# Patient Record
Sex: Female | Born: 1995 | Race: White | Hispanic: No | Marital: Single | Smoking: Never smoker
Health system: Southern US, Community
[De-identification: ages and names within clinical notes are randomized; demographics above are authoritative.]

## PROBLEM LIST (undated history)

## (undated) DIAGNOSIS — F419 Anxiety disorder, unspecified: Secondary | ICD-10-CM

## (undated) DIAGNOSIS — K8020 Calculus of gallbladder without cholecystitis without obstruction: Secondary | ICD-10-CM

## (undated) DIAGNOSIS — F32A Depression, unspecified: Secondary | ICD-10-CM

## (undated) DIAGNOSIS — F329 Major depressive disorder, single episode, unspecified: Secondary | ICD-10-CM

## (undated) HISTORY — PX: NO PAST SURGERIES: SHX2092

---

## 2016-06-05 ENCOUNTER — Encounter (HOSPITAL_COMMUNITY): Payer: Self-pay | Admitting: *Deleted

## 2016-06-05 ENCOUNTER — Emergency Department (HOSPITAL_COMMUNITY): Payer: BLUE CROSS/BLUE SHIELD

## 2016-06-05 ENCOUNTER — Emergency Department (HOSPITAL_COMMUNITY)
Admission: EM | Admit: 2016-06-05 | Discharge: 2016-06-06 | Disposition: A | Discharge status: Home / Self Care | Payer: BLUE CROSS/BLUE SHIELD | Attending: Emergency Medicine | Admitting: Emergency Medicine

## 2016-06-05 DIAGNOSIS — R1030 Lower abdominal pain, unspecified: Secondary | ICD-10-CM | POA: Diagnosis present

## 2016-06-05 DIAGNOSIS — K8020 Calculus of gallbladder without cholecystitis without obstruction: Secondary | ICD-10-CM | POA: Insufficient documentation

## 2016-06-05 DIAGNOSIS — R1011 Right upper quadrant pain: Secondary | ICD-10-CM

## 2016-06-05 LAB — COMPREHENSIVE METABOLIC PANEL
ALT: 18 U/L (ref 14–54)
ANION GAP: 8 (ref 5–15)
AST: 23 U/L (ref 15–41)
Albumin: 4.8 g/dL (ref 3.5–5.0)
Alkaline Phosphatase: 65 U/L (ref 38–126)
BILIRUBIN TOTAL: 0.6 mg/dL (ref 0.3–1.2)
BUN: 11 mg/dL (ref 6–20)
CO2: 27 mmol/L (ref 22–32)
Calcium: 9.5 mg/dL (ref 8.9–10.3)
Chloride: 106 mmol/L (ref 101–111)
Creatinine, Ser: 0.71 mg/dL (ref 0.44–1.00)
Glucose, Bld: 131 mg/dL — ABNORMAL HIGH (ref 65–99)
POTASSIUM: 3.5 mmol/L (ref 3.5–5.1)
Sodium: 141 mmol/L (ref 135–145)
TOTAL PROTEIN: 8.2 g/dL — AB (ref 6.5–8.1)

## 2016-06-05 LAB — URINALYSIS, ROUTINE W REFLEX MICROSCOPIC
Bilirubin Urine: NEGATIVE
Glucose, UA: NEGATIVE mg/dL
KETONES UR: NEGATIVE mg/dL
LEUKOCYTES UA: NEGATIVE
NITRITE: NEGATIVE
PH: 6 (ref 5.0–8.0)
PROTEIN: 30 mg/dL — AB
Specific Gravity, Urine: 1.034 — ABNORMAL HIGH (ref 1.005–1.030)

## 2016-06-05 LAB — CBC
HEMATOCRIT: 39.3 % (ref 36.0–46.0)
Hemoglobin: 12.9 g/dL (ref 12.0–15.0)
MCH: 29.5 pg (ref 26.0–34.0)
MCHC: 32.8 g/dL (ref 30.0–36.0)
MCV: 89.9 fL (ref 78.0–100.0)
PLATELETS: 324 10*3/uL (ref 150–400)
RBC: 4.37 MIL/uL (ref 3.87–5.11)
RDW: 13.7 % (ref 11.5–15.5)
WBC: 10.9 10*3/uL — AB (ref 4.0–10.5)

## 2016-06-05 LAB — URINE MICROSCOPIC-ADD ON

## 2016-06-05 LAB — LIPASE, BLOOD: LIPASE: 22 U/L (ref 11–51)

## 2016-06-05 LAB — POC URINE PREG, ED: Preg Test, Ur: NEGATIVE

## 2016-06-05 MED ORDER — ONDANSETRON 4 MG PO TBDP
4.0000 mg | ORAL_TABLET | Freq: Once | ORAL | Status: AC
  Administered 2016-06-05: 4 mg via ORAL
  Filled 2016-06-05: qty 1

## 2016-06-05 MED ORDER — DICYCLOMINE HCL 10 MG PO CAPS
20.0000 mg | ORAL_CAPSULE | Freq: Once | ORAL | Status: AC
  Administered 2016-06-05: 20 mg via ORAL
  Filled 2016-06-05: qty 2

## 2016-06-05 NOTE — ED Provider Notes (Signed)
[facility]-EMERGENCY DEPT Provider Note   CSN: [id number] Arrival date & time: 06/05/16  2025  By signing my name below, I, [name], attest that this documentation has been prepared under the direction and in the presence of Tomasita CrumbleAdeleke Keefe Zawistowski, MD. Electronically Signed: [name], ED Scribe. 06/05/16. 11:21 PM.   History   Chief Complaint Chief Complaint  Patient presents with  . Back Pain  . Emesis  . Abdominal Pain    HPI  Cassandra Ramirez is a 20 y.o. female who presents to the Emergency Department complaining of intermittent lower abdominal cramping onset 5 PM today. Pt notes that she ate a sandwich prior to the onset of her symptoms, but notes that she vomited following administration of ibuprofen. Denies recent suspicious food intake or sick contacts. She states that she is having associated symptoms of nausea, vomiting x 6 PM today, mid-lower back pain x 5 PM, and intermittent lower abdominal cramping. She states that she has tried OTC 600 mg ibuprofen with no relief for her symptoms. Pt denies diarrhea, difficulty urinating, dysuria, hematuria, fever, vaginal bleeding, vaginal discharge, and any other symptoms. Pt denies issues with her menstrual cycle. Denies abdominal surgeries.    The history is provided by the patient. No language interpreter was used.    History reviewed. No pertinent past medical history.  There are no active problems to display for this patient.   History reviewed. No pertinent surgical history.  OB History    No data available       Home Medications    Prior to Admission medications   Medication Sig Start Date End Date Taking? Authorizing Provider  ibuprofen (ADVIL,MOTRIN) 200 MG tablet Take 600 mg by mouth every 6 (six) hours as needed for moderate pain.   Yes Historical Provider, MD  ORSYTHIA 0.1-20 MG-MCG tablet Take 1 tablet by mouth daily. 04/08/16  Yes Historical Provider, MD    Family History No family history on file.  Social  History Social History  Substance Use Topics  . Smoking status: Never Smoker  . Smokeless tobacco: Never Used  . Alcohol use No     Allergies   Review of patient's allergies indicates no known allergies.   Review of Systems Review of Systems A complete 10 system review of systems was obtained and all systems are negative except as noted in the HPI and PMH.    Physical Exam Updated Vital Signs BP 110/81 (BP Location: Right Arm)   Pulse 74   Temp 97.5 F (36.4 C) (Oral)   Resp 19   LMP 06/05/2016   SpO2 100%   Physical Exam  Constitutional: She is oriented to person, place, and time. She appears well-developed and well-nourished. No distress.  HENT:  Head: Normocephalic and atraumatic.  Nose: Nose normal.  Mouth/Throat: Oropharynx is clear and moist. No oropharyngeal exudate.  Eyes: Conjunctivae and EOM are normal. Pupils are equal, round, and reactive to light. No scleral icterus.  Neck: Normal range of motion. Neck supple. No JVD present. No tracheal deviation present. No thyromegaly present.  Cardiovascular: Normal rate, regular rhythm and normal heart sounds.  Exam reveals no gallop and no friction rub.   No murmur heard. Pulmonary/Chest: Effort normal and breath sounds normal. No respiratory distress. She has no wheezes. She exhibits no tenderness.  Abdominal: Soft. Bowel sounds are normal. She exhibits no distension and no mass. There is tenderness in the right upper quadrant and epigastric area. There is positive Murphy's sign. There is no rebound and no  guarding.  Mid epigastric and RUQ tenderness with positive murphy sign.  Musculoskeletal: Normal range of motion. She exhibits no edema or tenderness.  Lymphadenopathy:    She has no cervical adenopathy.  Neurological: She is alert and oriented to person, place, and time. No cranial nerve deficit. She exhibits normal muscle tone.  Skin: Skin is warm and dry. No rash noted. No erythema. No pallor.  Nursing note and  vitals reviewed.    ED Treatments / Results  DIAGNOSTIC STUDIES: Oxygen Saturation is 100% on RA, nl by my interpretation.    COORDINATION OF CARE: 11:17 PM Discussed treatment plan with pt at bedside which includes UA, labs, US abdomen limited RUQ, and pt agreed to plan.   Labs (all labs ordered are listed, but only abnormal results are displayed) Labs Reviewed  COMPREHENSIVE METABOLIC PANEL - Abnormal; Notable for the following:       Result Value   Glucose, Bld 131 (*)    Total Protein 8.2 (*)    All other components within normal limits  CBC - Abnormal; Notable for the following:    WBC 10.9 (*)    All other components within normal limits  URINALYSIS, ROUTINE W REFLEX MICROSCOPIC (NOT AT [facility]) - Abnormal; Notable for the following:    APPearance CLOUDY (*)    Specific Gravity, Urine 1.034 (*)    Hgb urine dipstick LARGE (*)    Protein, ur 30 (*)    All other components within normal limits  URINE MICROSCOPIC-ADD ON - Abnormal; Notable for the following:    Squamous Epithelial / LPF 0-5 (*)    Bacteria, UA RARE (*)    All other components within normal limits  LIPASE, BLOOD  POC URINE PREG, ED    Radiology [place] Abdomen Limited Ruq  Result Date: 06/06/2016 CLINICAL DATA:  Right upper quadrant pain, nausea and vomiting since 17:00 EXAM: US ABDOMEN LIMITED - RIGHT UPPER QUADRANT COMPARISON:  None. FINDINGS: Gallbladder: Numerous small calculi within the gallbladder lumen measuring up to 5 mm. The patient was not tender to probe pressure over the gallbladder. There is no gallbladder wall thickening or pericholecystic fluid. Common bile duct: Diameter: 3.4 mm Liver: No focal lesion identified. Within normal limits in parenchymal echogenicity. IMPRESSION: Cholelithiasis.  No sonographic evidence of acute cholecystitis. Electronically Signed   By: Ellery Plunkaniel R Mitchell M.D.   On: 06/06/2016 01:09    Procedures Procedures (including critical care time)  Medications Ordered in  ED Medications  dicyclomine (BENTYL) capsule 20 mg (20 mg Oral Given 06/05/16 2330)  ondansetron (ZOFRAN-ODT) disintegrating tablet 4 mg (4 mg Oral Given 06/05/16 2330)     Initial Impression / Assessment and Plan / ED Course  I have reviewed the triage vital signs and the nursing notes.  Pertinent labs & imaging results that were available during my care of the patient were reviewed by me and considered in my medical decision making (see chart for details).  Clinical Course   Patient presents to the ED for abdominal pain in the RUQ with vomiting.  PE is concerning for possible gb pathology.  Labs however are reassuring as the LFTs are all normal.  Will obtain US for further evaluation.  She was given bentyl and zofran for her symptoms.    1:24 AM US reveals cholelithiasis.  Patient educated.  Surgery follow up provided.  Dc home with zofran.  She appears well and in NAD.  VS remain within her normal limits and she is safe for DC.  Final Clinical Impressions(s) / ED Diagnoses   Final diagnoses:  RUQ abdominal pain    New Prescriptions New Prescriptions   No medications on file   I personally performed the services described in this documentation, which was scribed in my presence. The recorded information has been reviewed and is accurate.      Tomasita Crumble, MD 06/06/16 [id number]

## 2016-06-05 NOTE — ED Triage Notes (Signed)
Pt complains of back and abdominal pain since 5PM after picking something up. Pt states she began vomiting 1 hour ago

## 2016-06-05 NOTE — ED Notes (Signed)
Pt made aware of need for urine specimen 

## 2016-06-06 MED ORDER — ONDANSETRON 4 MG PO TBDP: 4.0000 mg | ORAL_TABLET | Freq: Three times a day (TID) | ORAL | 0 refills | Status: DC | PRN

## 2016-06-08 ENCOUNTER — Emergency Department (HOSPITAL_COMMUNITY)
Admission: EM | Admit: 2016-06-08 | Discharge: 2016-06-08 | Disposition: A | Discharge status: Home / Self Care | Payer: BLUE CROSS/BLUE SHIELD | Attending: Emergency Medicine | Admitting: Emergency Medicine

## 2016-06-08 ENCOUNTER — Encounter (HOSPITAL_COMMUNITY): Payer: Self-pay | Admitting: Emergency Medicine

## 2016-06-08 ENCOUNTER — Emergency Department (HOSPITAL_COMMUNITY): Payer: BLUE CROSS/BLUE SHIELD

## 2016-06-08 DIAGNOSIS — R1011 Right upper quadrant pain: Secondary | ICD-10-CM

## 2016-06-08 DIAGNOSIS — R112 Nausea with vomiting, unspecified: Secondary | ICD-10-CM

## 2016-06-08 HISTORY — DX: Calculus of gallbladder without cholecystitis without obstruction: K80.20

## 2016-06-08 LAB — COMPREHENSIVE METABOLIC PANEL
ALT: 16 U/L (ref 14–54)
AST: 17 U/L (ref 15–41)
Albumin: 4.6 g/dL (ref 3.5–5.0)
Alkaline Phosphatase: 66 U/L (ref 38–126)
Anion gap: 10 (ref 5–15)
BILIRUBIN TOTAL: 0.9 mg/dL (ref 0.3–1.2)
BUN: 10 mg/dL (ref 6–20)
CALCIUM: 9.4 mg/dL (ref 8.9–10.3)
CHLORIDE: 103 mmol/L (ref 101–111)
CO2: 25 mmol/L (ref 22–32)
CREATININE: 0.83 mg/dL (ref 0.44–1.00)
Glucose, Bld: 119 mg/dL — ABNORMAL HIGH (ref 65–99)
Potassium: 3.6 mmol/L (ref 3.5–5.1)
Sodium: 138 mmol/L (ref 135–145)
TOTAL PROTEIN: 8.8 g/dL — AB (ref 6.5–8.1)

## 2016-06-08 LAB — URINALYSIS, ROUTINE W REFLEX MICROSCOPIC
Glucose, UA: NEGATIVE mg/dL
KETONES UR: 40 mg/dL — AB
NITRITE: NEGATIVE
PROTEIN: 30 mg/dL — AB
Specific Gravity, Urine: 1.03 (ref 1.005–1.030)
pH: 6 (ref 5.0–8.0)

## 2016-06-08 LAB — CBC
HCT: 41 % (ref 36.0–46.0)
Hemoglobin: 13.5 g/dL (ref 12.0–15.0)
MCH: 29.7 pg (ref 26.0–34.0)
MCHC: 32.9 g/dL (ref 30.0–36.0)
MCV: 90.1 fL (ref 78.0–100.0)
PLATELETS: 340 10*3/uL (ref 150–400)
RBC: 4.55 MIL/uL (ref 3.87–5.11)
RDW: 13.4 % (ref 11.5–15.5)
WBC: 19.4 10*3/uL — AB (ref 4.0–10.5)

## 2016-06-08 LAB — URINE MICROSCOPIC-ADD ON: RBC / HPF: NONE SEEN RBC/hpf (ref 0–5)

## 2016-06-08 LAB — LIPASE, BLOOD: LIPASE: 15 U/L (ref 11–51)

## 2016-06-08 MED ORDER — MORPHINE SULFATE (PF) 2 MG/ML IV SOLN
4.0000 mg | Freq: Once | INTRAVENOUS | Status: AC
  Administered 2016-06-08: 4 mg via INTRAVENOUS
  Filled 2016-06-08: qty 2

## 2016-06-08 MED ORDER — ONDANSETRON HCL 4 MG PO TABS: 4.0000 mg | ORAL_TABLET | Freq: Four times a day (QID) | ORAL | 0 refills | Status: DC

## 2016-06-08 MED ORDER — OXYCODONE-ACETAMINOPHEN 5-325 MG PO TABS: 2.0000 | ORAL_TABLET | ORAL | 0 refills | Status: DC | PRN

## 2016-06-08 MED ORDER — ONDANSETRON HCL 4 MG/2ML IJ SOLN
4.0000 mg | Freq: Once | INTRAMUSCULAR | Status: AC
  Administered 2016-06-08: 4 mg via INTRAVENOUS
  Filled 2016-06-08: qty 2

## 2016-06-08 MED ORDER — OXYCODONE-ACETAMINOPHEN 5-325 MG PO TABS
1.0000 | ORAL_TABLET | Freq: Once | ORAL | Status: AC
  Administered 2016-06-08: 1 via ORAL
  Filled 2016-06-08: qty 1

## 2016-06-08 NOTE — ED Triage Notes (Signed)
Pt reports RUQ pain since ED visit a few days ago. Was diagnosed with gallstones during that visit. Pt reports pain has gotten worse. No n/v/d

## 2016-06-08 NOTE — Discharge Instructions (Signed)

## 2016-06-08 NOTE — ED Provider Notes (Signed)
Emergency Department Provider Note   I have reviewed the triage vital signs and the nursing notes.   HISTORY  Chief Complaint Abdominal Pain   HPI Cassandra Ramirez is a 20 y.o. female previously diagnosed with gallstones on 11/2 present emergency department for evaluation of continued epigastric pain, nausea, vomiting. She was seen in the emergency department last week where an ultrasound showed gallstones with no evidence of cholecystitis. She followed up with general surgery and has an appointment in 2 days' time. She continued to have epigastric abdominal discomfort and developed new onset nausea and vomiting this evening. She denies fever or shaking chills. No chest pain or difficulty breathing. She's been unable to tolerate anything by mouth.    Past Medical History:  Diagnosis Date  . Gallstone     There are no active problems to display for this patient.   History reviewed. No pertinent surgical history.  Current Outpatient Rx  . Order #: [id number] Class: Historical Med  . Order #: [id number] Class: Print  . Order #: [id number] Class: Historical Med  . Order #: [id number] Class: Print  . Order #: [id number] Class: Print    Allergies Patient has no known allergies.  History reviewed. No pertinent family history.  Social History Social History  Substance Use Topics  . Smoking status: Never Smoker  . Smokeless tobacco: Never Used  . Alcohol use No    Review of Systems  Constitutional: No fever/chills Eyes: No visual changes. ENT: No sore throat. Cardiovascular: Denies chest pain. Respiratory: Denies shortness of breath. Gastrointestinal: Positive RUQ abdominal pain. Positive nausea and vomiting.  No diarrhea.  No constipation. Genitourinary: Negative for dysuria. Musculoskeletal: Negative for back pain. Skin: Negative for rash. Neurological: Negative for headaches, focal weakness or numbness.  10-point ROS otherwise  negative.  ____________________________________________   PHYSICAL EXAM:  VITAL SIGNS: ED Triage Vitals  Enc Vitals Group     BP 06/08/16 1714 113/67     Pulse Rate 06/08/16 1714 76     Resp 06/08/16 1714 16     Temp 06/08/16 1714 98.7 F (37.1 C)     Temp Source 06/08/16 1714 Oral     SpO2 06/08/16 1714 98 %     Weight 06/08/16 1715 130 lb (59 kg)     Height 06/08/16 1715 5\' 2"  (1.575 m)     Pain Score 06/08/16 1717 8   Constitutional: Alert and oriented. Appears uncomfortable with active vomiting.  Eyes: Conjunctivae are normal. Head: Atraumatic. Nose: No congestion/rhinnorhea. Mouth/Throat: Mucous membranes are moist.  Oropharynx non-erythematous. Neck: No stridor.  Cardiovascular: Normal rate, regular rhythm. Good peripheral circulation. Grossly normal heart sounds.   Respiratory: Normal respiratory effort.  No retractions. Lungs CTAB. Gastrointestinal: Soft with RUQ tenderness. No rebound or guarding. No distention.  Musculoskeletal: No lower extremity tenderness nor edema. No gross deformities of extremities. Neurologic:  Normal speech and language. No gross focal neurologic deficits are appreciated.  Skin:  Skin is warm, dry and intact. No rash noted.  ____________________________________________   LABS (all labs ordered are listed, but only abnormal results are displayed)  Labs Reviewed  COMPREHENSIVE METABOLIC PANEL - Abnormal; Notable for the following:       Result Value   Glucose, Bld 119 (*)    Total Protein 8.8 (*)    All other components within normal limits  CBC - Abnormal; Notable for the following:    WBC 19.4 (*)    All other components within normal limits  URINALYSIS, ROUTINE W REFLEX MICROSCOPIC (NOT AT [facility]) -  Abnormal; Notable for the following:    Color, Urine AMBER (*)    APPearance CLOUDY (*)    Hgb urine dipstick TRACE (*)    Bilirubin Urine SMALL (*)    Ketones, ur 40 (*)    Protein, ur 30 (*)    Leukocytes, UA SMALL (*)    All  other components within normal limits  URINE MICROSCOPIC-ADD ON - Abnormal; Notable for the following:    Squamous Epithelial / LPF TOO NUMEROUS TO COUNT (*)    Bacteria, UA MANY (*)    All other components within normal limits  LIPASE, BLOOD   ____________________________________________  RADIOLOGY  [place] Abdomen Limited Ruq  Result Date: 06/08/2016 CLINICAL DATA:  Worsening right upper quadrant abdominal pain for 3 days. Cholelithiasis. EXAM: US ABDOMEN LIMITED - RIGHT UPPER QUADRANT COMPARISON:  06/06/2016 FINDINGS: Gallbladder: Multiple tiny gallstones are again seen measuring up to 4 mm. No evidence of gallbladder wall thickening or pericholecystic fluid. No sonographic Murphy sign noted by sonographer. Common bile duct: Diameter: 4 mm, within normal limits. Liver: No focal lesion identified. Within normal limits in parenchymal echogenicity. IMPRESSION: Cholelithiasis again noted. No sonographic signs of acute cholecystitis or biliary dilatation. Electronically Signed   By: Myles RosenthalJohn  Stahl M.D.   On: 06/08/2016 20:54    ____________________________________________   PROCEDURES  Procedure(s) performed:   Procedures  None ____________________________________________   INITIAL IMPRESSION / ASSESSMENT AND PLAN / ED COURSE  Pertinent labs & imaging results that were available during my care of the patient were reviewed by me and considered in my medical decision making (see chart for details).  Patient presents to the emergency department for evaluation of epigastric abdominal discomfort, nausea, vomiting. She has a tender epigastrium and active vomiting during my exam. Given Zofran and will send for repeat ultrasound right upper quadrant to rule out acute cholecystitis. She does have a white count of 19 but is afebrile.  10:59 PM Spoke with general surgery Dr. Daphine DeutscherMartin in the OR. With normal liver enzymes he would prefer pain control and discharge home with scheduled outpatient surgery  follow up. Patient is feeling better after morphine and percocet. Mom and Dad at bedside. Plan for discharge to home and pain and nausea medication with plan to follow with surgery on Tuesday. Discussed return precautions in detail including reasons to return sooner.  ____________________________________________  FINAL CLINICAL IMPRESSION(S) / ED DIAGNOSES  Final diagnoses:  RUQ pain  Right upper quadrant abdominal pain  Non-intractable vomiting with nausea, unspecified vomiting type     MEDICATIONS GIVEN DURING THIS VISIT:  Medications  ondansetron (ZOFRAN) injection 4 mg (4 mg Intravenous Given 06/08/16 2002)  morphine 2 MG/ML injection 4 mg (4 mg Intravenous Given 06/08/16 2201)  oxyCODONE-acetaminophen (PERCOCET/ROXICET) 5-325 MG per tablet 1 tablet (1 tablet Oral Given 06/08/16 2310)     NEW OUTPATIENT MEDICATIONS STARTED DURING THIS VISIT:  Discharge Medication List as of 06/08/2016 11:03 PM    START taking these medications   Details  ondansetron (ZOFRAN) 4 MG tablet Take 1 tablet (4 mg total) by mouth every 6 (six) hours., Starting Sun 06/08/2016, Print    oxyCODONE-acetaminophen (PERCOCET/ROXICET) 5-325 MG tablet Take 2 tablets by mouth every 4 (four) hours as needed for severe pain., Starting Sun 06/08/2016, Print          Note:  This document was prepared using Dragon voice recognition software and may include unintentional dictation errors.  Alona BeneJoshua Long, MD Emergency Medicine   Maia PlanJoshua G Long, MD 06/09/16 1044

## 2016-06-08 NOTE — ED Notes (Signed)
US tech at bedside

## 2016-06-10 ENCOUNTER — Encounter (HOSPITAL_COMMUNITY): Payer: Self-pay | Admitting: *Deleted

## 2016-06-10 ENCOUNTER — Other Ambulatory Visit: Payer: Self-pay | Admitting: General Surgery

## 2016-06-10 ENCOUNTER — Inpatient Hospital Stay (HOSPITAL_COMMUNITY)
Admission: AD | Admit: 2016-06-10 | Discharge: 2016-06-13 | DRG: 419 | Disposition: A | Discharge status: Home / Self Care | Payer: BLUE CROSS/BLUE SHIELD | Source: Ambulatory Visit | Attending: General Surgery | Admitting: General Surgery

## 2016-06-10 ENCOUNTER — Ambulatory Visit: Payer: Self-pay | Admitting: General Surgery

## 2016-06-10 DIAGNOSIS — K8000 Calculus of gallbladder with acute cholecystitis without obstruction: Principal | ICD-10-CM | POA: Diagnosis present

## 2016-06-10 DIAGNOSIS — K8020 Calculus of gallbladder without cholecystitis without obstruction: Secondary | ICD-10-CM

## 2016-06-10 DIAGNOSIS — Z8249 Family history of ischemic heart disease and other diseases of the circulatory system: Secondary | ICD-10-CM

## 2016-06-10 DIAGNOSIS — F329 Major depressive disorder, single episode, unspecified: Secondary | ICD-10-CM | POA: Diagnosis present

## 2016-06-10 DIAGNOSIS — M549 Dorsalgia, unspecified: Secondary | ICD-10-CM | POA: Diagnosis present

## 2016-06-10 DIAGNOSIS — K810 Acute cholecystitis: Secondary | ICD-10-CM | POA: Diagnosis present

## 2016-06-10 DIAGNOSIS — Z833 Family history of diabetes mellitus: Secondary | ICD-10-CM

## 2016-06-10 DIAGNOSIS — F419 Anxiety disorder, unspecified: Secondary | ICD-10-CM | POA: Diagnosis present

## 2016-06-10 HISTORY — DX: Depression, unspecified: F32.A

## 2016-06-10 HISTORY — DX: Major depressive disorder, single episode, unspecified: F32.9

## 2016-06-10 HISTORY — DX: Anxiety disorder, unspecified: F41.9

## 2016-06-10 MED ORDER — KCL IN DEXTROSE-NACL 20-5-0.45 MEQ/L-%-% IV SOLN
INTRAVENOUS | Status: DC
Start: 2016-06-10 — End: 2016-06-13
  Administered 2016-06-10 – 2016-06-12 (×3): via INTRAVENOUS
  Filled 2016-06-10 (×6): qty 1000

## 2016-06-10 MED ORDER — HYDROMORPHONE HCL 1 MG/ML IJ SOLN
0.5000 mg | INTRAMUSCULAR | Status: DC | PRN
Start: 2016-06-10 — End: 2016-06-13
  Administered 2016-06-11 – 2016-06-12 (×8): 0.5 mg via INTRAVENOUS
  Filled 2016-06-10 (×8): qty 1

## 2016-06-10 MED ORDER — CHLORHEXIDINE GLUCONATE CLOTH 2 % EX PADS
6.0000 | MEDICATED_PAD | Freq: Once | CUTANEOUS | Status: AC
  Administered 2016-06-10: 6 via TOPICAL

## 2016-06-10 MED ORDER — HYDROCODONE-ACETAMINOPHEN 5-325 MG PO TABS
1.0000 | ORAL_TABLET | ORAL | Status: DC | PRN
  Administered 2016-06-11: 2 via ORAL
  Administered 2016-06-12: 1 via ORAL
  Administered 2016-06-12 (×2): 2 via ORAL
  Administered 2016-06-12: 1 via ORAL
  Administered 2016-06-13: 2 via ORAL
  Filled 2016-06-10: qty 2
  Filled 2016-06-10: qty 1
  Filled 2016-06-10 (×4): qty 2

## 2016-06-10 MED ORDER — CHLORHEXIDINE GLUCONATE CLOTH 2 % EX PADS: 6.0000 | MEDICATED_PAD | Freq: Once | CUTANEOUS | Status: DC

## 2016-06-10 MED ORDER — SODIUM CHLORIDE 0.45 % IV SOLN: INTRAVENOUS | Status: DC

## 2016-06-10 MED ORDER — CEFAZOLIN SODIUM-DEXTROSE 2-4 GM/100ML-% IV SOLN: 2.0000 g | INTRAVENOUS | Status: DC

## 2016-06-10 MED ORDER — PIPERACILLIN-TAZOBACTAM 3.375 G IVPB
3.3750 g | Freq: Three times a day (TID) | INTRAVENOUS | Status: DC
  Administered 2016-06-10 – 2016-06-13 (×9): 3.375 g via INTRAVENOUS
  Filled 2016-06-10 (×10): qty 50

## 2016-06-10 MED ORDER — ONDANSETRON HCL 4 MG/2ML IJ SOLN: 2.0000 mg | Freq: Four times a day (QID) | INTRAMUSCULAR | Status: DC | PRN

## 2016-06-10 NOTE — H&P (Signed)
History of Present Illness Cassandra Ramirez(Cassandra Vialpando MD; 06/10/2016 3:15 PM) The patient is a 20 year old female who presents for evaluation of gall stones. The patient is a 20 year old female with a 3 to four-day history of right upper quadrant abdominal pain. Patient was recently seen in the ER twice in 1 day secondary to right upper quadrant pain, nausea, vomiting. Patient underwent ultrasound which revealed gallstones. Patient had an elevated WBC count of 19.  Patient states that she's continue with abdominal pain. She set minimal by mouth intake.  She was denies any fever or chills night sweats.   Other Problems Cassandra Ramirez(Cassandra [place], [place]; 06/10/2016 2:28 PM) Anxiety Disorder Back Pain Depression  Past Surgical History Cassandra Ramirez(Cassandra [place], [place]; 06/10/2016 2:28 PM) No pertinent past surgical history  Diagnostic Studies History Cassandra Ramirez(Cassandra [place], [place]; 06/10/2016 2:28 PM) Colonoscopy never Mammogram never Pap Smear never  Allergies Cassandra Ramirez(Cassandra Ramirez, CMA; 06/10/2016 2:31 PM) No Known Drug Allergies11/02/2016  Medication History Cassandra Ramirez(Cassandra [place], [place]; 06/10/2016 2:32 PM) Ondansetron (4MG  Film, Oral) Active. Oxycodone-Acetaminophen (5-325MG  Tablet, Oral) Active. Levonorgest-Eth Estrad 91-Day (0.1-0.02 & 0.01MG  Tablet, Oral) Active. Medications Reconciled  Social History Cassandra Ramirez(Cassandra Ramirez, [place]; 06/10/2016 2:28 PM) Caffeine use Carbonated beverages, Tea. No alcohol use No drug use Tobacco use Never smoker.  Family History Cassandra Ramirez(Cassandra [place], [place]; 06/10/2016 2:28 PM) Diabetes Mellitus Mother. Heart Disease Father.  Pregnancy / Birth History Cassandra Ramirez(Cassandra [place], [place]; 06/10/2016 2:28 PM) Age at menarche 12 years. Contraceptive History Oral contraceptives. Gravida 0 Para 0 Regular periods    Review of Systems Cassandra Ramirez(Cassandra Ramirez CMA; 06/10/2016 2:28 PM) General Present- Appetite Loss and Fatigue. Not Present- Chills, Fever, Night Sweats, Weight Gain and Weight Loss. Skin Not Present- Change in  Wart/Mole, Dryness, Hives, Jaundice, New Lesions, Non-Healing Wounds, Rash and Ulcer. HEENT Present- Wears glasses/contact lenses. Not Present- Earache, Hearing Loss, Hoarseness, Nose Bleed, Oral Ulcers, Ringing in the Ears, Seasonal Allergies, Sinus Pain, Sore Throat, Visual Disturbances and Yellow Eyes. Respiratory Not Present- Bloody sputum, Chronic Cough, Difficulty Breathing, Snoring and Wheezing. Breast Not Present- Breast Mass, Breast Pain, Nipple Discharge and Skin Changes. Cardiovascular Not Present- Chest Pain, Difficulty Breathing Lying Down, Leg Cramps, Palpitations, Rapid Heart Rate, Shortness of Breath and Swelling of Extremities. Gastrointestinal Present- Abdominal Pain, Nausea and Vomiting. Not Present- Bloating, Bloody Stool, Change in Bowel Habits, Chronic diarrhea, Constipation, Difficulty Swallowing, Excessive gas, Gets full quickly at meals, Hemorrhoids, Indigestion and Rectal Pain. Female Genitourinary Not Present- Frequency, Nocturia, Painful Urination, Pelvic Pain and Urgency. Musculoskeletal Present- Back Pain. Not Present- Joint Pain, Joint Stiffness, Muscle Pain, Muscle Weakness and Swelling of Extremities. Neurological Present- Decreased Memory, Trouble walking and Weakness. Not Present- Fainting, Headaches, Numbness, Seizures, Tingling and Tremor. Psychiatric Present- Change in Sleep Pattern, Depression and Fearful. Not Present- Anxiety, Bipolar and Frequent crying. Endocrine Not Present- Cold Intolerance, Excessive Hunger, Hair Changes, Heat Intolerance, Hot flashes and New Diabetes. Hematology Not Present- Blood Thinners, Easy Bruising, Excessive bleeding, Gland problems, HIV and Persistent Infections.  Vitals Cassandra Ramirez(Cassandra Ramirez CMA; 06/10/2016 2:33 PM) 06/10/2016 2:32 PM Weight: 132.2 lb (59th percentile) Height: 62in (18th percentile) Body Surface Area: 1.6 m Body Mass Index: 24.18 kg/m  (74th percentile)  Temp.: 98.96F  Pulse: 112 (Regular)  BP:  112/62 (Sitting, Left Arm, Standard)  Percentiles calculated using CDC data for children 2-20 years.     Physical Exam Cassandra Ramirez(Cassandra Cromartie MD; 06/10/2016 3:16 PM) General Mental Status-Alert. General Appearance-Consistent with stated age. Hydration-Well hydrated. Voice-Normal.  Head and Neck Head-normocephalic, atraumatic with no lesions or palpable masses.  Eye Eyeball -  Bilateral-Extraocular movements intact. Sclera/Conjunctiva - Bilateral-No scleral icterus.  Chest and Lung Exam Chest and lung exam reveals -quiet, even and easy respiratory effort with no use of accessory muscles. Inspection Chest Wall - Normal. Back - normal.  Cardiovascular Cardiovascular examination reveals -normal heart sounds, regular rate and rhythm with no murmurs.  Abdomen Inspection Normal Exam - No Hernias. Palpation/Percussion Normal exam - Soft, No Rebound tenderness, No Rigidity (guarding) and No hepatosplenomegaly. Tenderness - Right Upper Quadrant(With a fullness sensation to the right upper quadrant). Auscultation Normal exam - Bowel sounds normal.  Neurologic Neurologic evaluation reveals -alert and oriented x 3 with no impairment of recent or remote memory. Mental Status-Normal.  Musculoskeletal Normal Exam - Left-Upper Extremity Strength Normal and Lower Extremity Strength Normal. Normal Exam - Right-Upper Extremity Strength Normal, Lower Extremity Weakness.    Assessment & Plan Cassandra Ramirez(Cassandra Odeh MD; 06/10/2016 3:17 PM) ACUTE CHOLECYSTITIS (K81.0) Impression: 20 year old female with likely acute cholecystitis, cholelithiasis  1. We'll plan on laparoscopic cholecystectomy +/- IOC 2. I spoke with Dr. Carolynne Edouardoth in regards to admission, proceeding with surgery.

## 2016-06-10 NOTE — Progress Notes (Signed)
Pt arrived unit as a direct admission. Pt is alert and oriented, able to communicate needs. Will continue with current plan of care.

## 2016-06-11 ENCOUNTER — Encounter (HOSPITAL_COMMUNITY): Admission: AD | Disposition: A | Discharge status: Home / Self Care | Payer: Self-pay | Source: Ambulatory Visit

## 2016-06-11 ENCOUNTER — Observation Stay (HOSPITAL_COMMUNITY): Payer: BLUE CROSS/BLUE SHIELD

## 2016-06-11 ENCOUNTER — Observation Stay (HOSPITAL_COMMUNITY): Payer: BLUE CROSS/BLUE SHIELD | Admitting: Anesthesiology

## 2016-06-11 ENCOUNTER — Encounter (HOSPITAL_COMMUNITY): Payer: Self-pay | Admitting: Certified Registered"

## 2016-06-11 HISTORY — PX: CHOLECYSTECTOMY: SHX55

## 2016-06-11 LAB — SURGICAL PCR SCREEN
MRSA, PCR: NEGATIVE
STAPHYLOCOCCUS AUREUS: NEGATIVE

## 2016-06-11 SURGERY — LAPAROSCOPIC CHOLECYSTECTOMY WITH INTRAOPERATIVE CHOLANGIOGRAM
Anesthesia: General | Site: Abdomen

## 2016-06-11 MED ORDER — LACTATED RINGERS IR SOLN
Status: DC | PRN
  Administered 2016-06-11: 3000 mL

## 2016-06-11 MED ORDER — SUCCINYLCHOLINE CHLORIDE 20 MG/ML IJ SOLN
INTRAMUSCULAR | Status: AC
  Filled 2016-06-11: qty 1

## 2016-06-11 MED ORDER — MIDAZOLAM HCL 2 MG/2ML IJ SOLN: 0.5000 mg | Freq: Once | INTRAMUSCULAR | Status: DC | PRN

## 2016-06-11 MED ORDER — LACTATED RINGERS IV SOLN
INTRAVENOUS | Status: DC
Start: 2016-06-11 — End: 2016-06-11

## 2016-06-11 MED ORDER — HYDROMORPHONE HCL 1 MG/ML IJ SOLN
0.2500 mg | INTRAMUSCULAR | Status: DC | PRN
  Administered 2016-06-11 (×2): 0.5 mg via INTRAVENOUS

## 2016-06-11 MED ORDER — ROCURONIUM BROMIDE 50 MG/5ML IV SOSY
PREFILLED_SYRINGE | INTRAVENOUS | Status: DC | PRN
  Administered 2016-06-11: 40 mg via INTRAVENOUS
  Administered 2016-06-11: 5 mg via INTRAVENOUS

## 2016-06-11 MED ORDER — ONDANSETRON HCL 4 MG/2ML IJ SOLN
INTRAMUSCULAR | Status: AC
  Filled 2016-06-11: qty 2

## 2016-06-11 MED ORDER — HYDROMORPHONE HCL 1 MG/ML IJ SOLN
INTRAMUSCULAR | Status: AC
  Filled 2016-06-11: qty 1

## 2016-06-11 MED ORDER — IOPAMIDOL (ISOVUE-300) INJECTION 61%
INTRAVENOUS | Status: DC | PRN
  Administered 2016-06-11: 2.5 mL

## 2016-06-11 MED ORDER — ONDANSETRON HCL 4 MG/2ML IJ SOLN
INTRAMUSCULAR | Status: DC | PRN
  Administered 2016-06-11: 4 mg via INTRAVENOUS

## 2016-06-11 MED ORDER — LIDOCAINE 2% (20 MG/ML) 5 ML SYRINGE
INTRAMUSCULAR | Status: AC
  Filled 2016-06-11: qty 5

## 2016-06-11 MED ORDER — FENTANYL CITRATE (PF) 250 MCG/5ML IJ SOLN
INTRAMUSCULAR | Status: AC
  Filled 2016-06-11: qty 5

## 2016-06-11 MED ORDER — FENTANYL CITRATE (PF) 250 MCG/5ML IJ SOLN
INTRAMUSCULAR | Status: DC | PRN
  Administered 2016-06-11 (×3): 50 ug via INTRAVENOUS
  Administered 2016-06-11 (×2): 25 ug via INTRAVENOUS

## 2016-06-11 MED ORDER — PROPOFOL 10 MG/ML IV BOLUS
INTRAVENOUS | Status: AC
  Filled 2016-06-11: qty 20

## 2016-06-11 MED ORDER — ROCURONIUM BROMIDE 50 MG/5ML IV SOSY
PREFILLED_SYRINGE | INTRAVENOUS | Status: AC
Start: 2016-06-11 — End: 2016-06-11
  Filled 2016-06-11: qty 5

## 2016-06-11 MED ORDER — MIDAZOLAM HCL 2 MG/2ML IJ SOLN
INTRAMUSCULAR | Status: AC
  Filled 2016-06-11: qty 2

## 2016-06-11 MED ORDER — LACTATED RINGERS IV SOLN
INTRAVENOUS | Status: DC | PRN
  Administered 2016-06-11 (×3): via INTRAVENOUS

## 2016-06-11 MED ORDER — MIDAZOLAM HCL 5 MG/5ML IJ SOLN
INTRAMUSCULAR | Status: DC | PRN
  Administered 2016-06-11 (×2): 1 mg via INTRAVENOUS

## 2016-06-11 MED ORDER — DEXAMETHASONE SODIUM PHOSPHATE 10 MG/ML IJ SOLN
INTRAMUSCULAR | Status: DC | PRN
  Administered 2016-06-11: 10 mg via INTRAVENOUS

## 2016-06-11 MED ORDER — IOPAMIDOL (ISOVUE-300) INJECTION 61%
INTRAVENOUS | Status: AC
  Filled 2016-06-11: qty 50

## 2016-06-11 MED ORDER — MEPERIDINE HCL 50 MG/ML IJ SOLN: 6.2500 mg | INTRAMUSCULAR | Status: DC | PRN

## 2016-06-11 MED ORDER — PROPOFOL 10 MG/ML IV BOLUS
INTRAVENOUS | Status: DC | PRN
  Administered 2016-06-11: 140 mg via INTRAVENOUS
  Administered 2016-06-11: 40 mg via INTRAVENOUS
  Administered 2016-06-11: 20 mg via INTRAVENOUS

## 2016-06-11 MED ORDER — SUGAMMADEX SODIUM 200 MG/2ML IV SOLN
INTRAVENOUS | Status: AC
  Filled 2016-06-11: qty 2

## 2016-06-11 MED ORDER — 0.9 % SODIUM CHLORIDE (POUR BTL) OPTIME
TOPICAL | Status: DC | PRN
  Administered 2016-06-11: 1000 mL

## 2016-06-11 MED ORDER — BUPIVACAINE HCL (PF) 0.25 % IJ SOLN
INTRAMUSCULAR | Status: DC | PRN
  Administered 2016-06-11: 25 mL

## 2016-06-11 MED ORDER — DIPHENHYDRAMINE HCL 50 MG/ML IJ SOLN
12.5000 mg | Freq: Four times a day (QID) | INTRAMUSCULAR | Status: DC | PRN
  Administered 2016-06-12: 12.5 mg via INTRAVENOUS
  Filled 2016-06-11: qty 1

## 2016-06-11 MED ORDER — SUGAMMADEX SODIUM 200 MG/2ML IV SOLN
INTRAVENOUS | Status: DC | PRN
  Administered 2016-06-11: 150 mg via INTRAVENOUS

## 2016-06-11 MED ORDER — PROMETHAZINE HCL 25 MG/ML IJ SOLN: 6.2500 mg | INTRAMUSCULAR | Status: DC | PRN

## 2016-06-11 MED ORDER — LIDOCAINE 2% (20 MG/ML) 5 ML SYRINGE
INTRAMUSCULAR | Status: DC | PRN
  Administered 2016-06-11: 20 mg via INTRAVENOUS

## 2016-06-11 MED ORDER — DEXAMETHASONE SODIUM PHOSPHATE 10 MG/ML IJ SOLN
INTRAMUSCULAR | Status: AC
  Filled 2016-06-11: qty 1

## 2016-06-11 MED ORDER — BUPIVACAINE HCL (PF) 0.25 % IJ SOLN
INTRAMUSCULAR | Status: AC
  Filled 2016-06-11: qty 30

## 2016-06-11 SURGICAL SUPPLY — 30 items
APPLIER CLIP 5 13 M/L LIGAMAX5 (MISCELLANEOUS) ×2
CABLE HIGH FREQUENCY MONO STRZ (ELECTRODE) ×2 IMPLANT
CATH REDDICK CHOLANGI 4FR 50CM (CATHETERS) ×2 IMPLANT
CHLORAPREP W/TINT 26ML (MISCELLANEOUS) ×2 IMPLANT
CLIP APPLIE 5 13 M/L LIGAMAX5 (MISCELLANEOUS) ×1 IMPLANT
COVER MAYO STAND STRL (DRAPES) ×2 IMPLANT
COVER SURGICAL LIGHT HANDLE (MISCELLANEOUS) ×2 IMPLANT
DECANTER SPIKE VIAL GLASS SM (MISCELLANEOUS) ×2 IMPLANT
DERMABOND ADVANCED (GAUZE/BANDAGES/DRESSINGS) ×1
DERMABOND ADVANCED .7 DNX12 (GAUZE/BANDAGES/DRESSINGS) ×1 IMPLANT
DRAPE C-ARM 42X120 X-RAY (DRAPES) ×2 IMPLANT
ELECT REM PT RETURN 9FT ADLT (ELECTROSURGICAL) ×2
ELECTRODE REM PT RTRN 9FT ADLT (ELECTROSURGICAL) ×1 IMPLANT
GLOVE BIO SURGEON STRL SZ7.5 (GLOVE) ×2 IMPLANT
GOWN STRL REUS W/TWL XL LVL3 (GOWN DISPOSABLE) ×6 IMPLANT
HEMOSTAT SURGICEL 4X8 (HEMOSTASIS) IMPLANT
IRRIG SUCT STRYKERFLOW 2 WTIP (MISCELLANEOUS) ×2
IRRIGATION SUCT STRKRFLW 2 WTP (MISCELLANEOUS) ×1 IMPLANT
IV CATH 14GX2 1/4 (CATHETERS) ×2 IMPLANT
KIT BASIN OR (CUSTOM PROCEDURE TRAY) ×2 IMPLANT
POUCH SPECIMEN RETRIEVAL 10MM (ENDOMECHANICALS) ×2 IMPLANT
SCISSORS LAP 5X35 DISP (ENDOMECHANICALS) ×2 IMPLANT
SLEEVE XCEL OPT CAN 5 100 (ENDOMECHANICALS) ×4 IMPLANT
SUT MNCRL AB 4-0 PS2 18 (SUTURE) ×2 IMPLANT
TOWEL OR 17X26 10 PK STRL BLUE (TOWEL DISPOSABLE) ×2 IMPLANT
TOWEL OR NON WOVEN STRL DISP B (DISPOSABLE) ×2 IMPLANT
TRAY LAPAROSCOPIC (CUSTOM PROCEDURE TRAY) ×2 IMPLANT
TROCAR BLADELESS OPT 5 100 (ENDOMECHANICALS) ×2 IMPLANT
TROCAR XCEL BLUNT TIP 100MML (ENDOMECHANICALS) ×2 IMPLANT
TUBING INSUF HEATED (TUBING) ×2 IMPLANT

## 2016-06-11 NOTE — Anesthesia Postprocedure Evaluation (Signed)
Anesthesia Post Note  Patient: Cassandra PartridgeRachel Ramirez  Procedure(s) Performed: Procedure(s) (LRB): LAPAROSCOPIC CHOLECYSTECTOMY WITH INTRAOPERATIVE CHOLANGIOGRAM (N/A)  Patient location during evaluation: PACU Anesthesia Type: General Level of consciousness: sedated, oriented and patient cooperative Pain management: pain level controlled Vital Signs Assessment: post-procedure vital signs reviewed and stable Respiratory status: spontaneous breathing, nonlabored ventilation and respiratory function stable Cardiovascular status: blood pressure returned to baseline and stable Postop Assessment: no signs of nausea or vomiting Anesthetic complications: no    Last Vitals:  Vitals:   06/11/16 1342 06/11/16 1350  BP: 122/76 110/64  Pulse: 88 86  Resp: 15 14  Temp: 37.1 C 36.8 C    Last Pain:  Vitals:   06/11/16 1350  TempSrc: Oral  PainSc:                  Addaline Peplinski,E. Jameson Tormey

## 2016-06-11 NOTE — Anesthesia Procedure Notes (Signed)
Procedure Name: Intubation Date/Time: 06/11/2016 10:44 AM Performed by: Minerva EndsMIRARCHI, Aoi Kouns M Pre-anesthesia Checklist: Patient identified, Emergency Drugs available, Suction available and Patient being monitored Patient Re-evaluated:Patient Re-evaluated prior to inductionOxygen Delivery Method: Circle System Utilized Preoxygenation: Pre-oxygenation with 100% oxygen Intubation Type: IV induction Ventilation: Mask ventilation without difficulty Laryngoscope Size: Miller and 2 Grade View: Grade I Tube type: Oral Number of attempts: 1 Airway Equipment and Method: Stylet Placement Confirmation: ETT inserted through vocal cords under direct vision,  positive ETCO2 and breath sounds checked- equal and bilateral Secured at: 21 cm Tube secured with: Tape Dental Injury: Teeth and Oropharynx as per pre-operative assessment  Comments: Smooth IV induction Jean RosenthalJackson--- intubation AM CRNA-- atraumatic--- teeth and mouth as preop-- bilat BS Jean RosenthalJackson

## 2016-06-11 NOTE — Progress Notes (Signed)
Pt back to unit from PACU. Drowsy but easily arusable. Will continue with current plan of care.

## 2016-06-11 NOTE — H&P (View-Only) (Signed)
History of Present Illness (Sandrine Bloodsworth MD; 06/10/2016 3:15 PM) The patient is a 20 year old female who presents for evaluation of gall stones. The patient is a 20-year-old female with a 3 to four-day history of right upper quadrant abdominal pain. Patient was recently seen in the ER twice in 1 day secondary to right upper quadrant pain, nausea, vomiting. Patient underwent ultrasound which revealed gallstones. Patient had an elevated WBC count of 19.  Patient states that she's continue with abdominal pain. She set minimal by mouth intake.  She was denies any fever or chills night sweats.   Other Problems (Sade Bradford, CMA; 06/10/2016 2:28 PM) Anxiety Disorder Back Pain Depression  Past Surgical History (Sade Bradford, CMA; 06/10/2016 2:28 PM) No pertinent past surgical history  Diagnostic Studies History (Sade Bradford, CMA; 06/10/2016 2:28 PM) Colonoscopy never Mammogram never Pap Smear never  Allergies (Sade Bradford, CMA; 06/10/2016 2:31 PM) No Known Drug Allergies11/02/2016  Medication History (Sade Bradford, CMA; 06/10/2016 2:32 PM) Ondansetron (4MG Film, Oral) Active. Oxycodone-Acetaminophen (5-325MG Tablet, Oral) Active. Levonorgest-Eth Estrad 91-Day (0.1-0.02 & 0.01MG Tablet, Oral) Active. Medications Reconciled  Social History (Sade Bradford, CMA; 06/10/2016 2:28 PM) Caffeine use Carbonated beverages, Tea. No alcohol use No drug use Tobacco use Never smoker.  Family History (Sade Bradford, CMA; 06/10/2016 2:28 PM) Diabetes Mellitus Mother. Heart Disease Father.  Pregnancy / Birth History (Sade Bradford, CMA; 06/10/2016 2:28 PM) Age at menarche 12 years. Contraceptive History Oral contraceptives. Gravida 0 Para 0 Regular periods    Review of Systems (Sade Bradford CMA; 06/10/2016 2:28 PM) General Present- Appetite Loss and Fatigue. Not Present- Chills, Fever, Night Sweats, Weight Gain and Weight Loss. Skin Not Present- Change in  Wart/Mole, Dryness, Hives, Jaundice, New Lesions, Non-Healing Wounds, Rash and Ulcer. HEENT Present- Wears glasses/contact lenses. Not Present- Earache, Hearing Loss, Hoarseness, Nose Bleed, Oral Ulcers, Ringing in the Ears, Seasonal Allergies, Sinus Pain, Sore Throat, Visual Disturbances and Yellow Eyes. Respiratory Not Present- Bloody sputum, Chronic Cough, Difficulty Breathing, Snoring and Wheezing. Breast Not Present- Breast Mass, Breast Pain, Nipple Discharge and Skin Changes. Cardiovascular Not Present- Chest Pain, Difficulty Breathing Lying Down, Leg Cramps, Palpitations, Rapid Heart Rate, Shortness of Breath and Swelling of Extremities. Gastrointestinal Present- Abdominal Pain, Nausea and Vomiting. Not Present- Bloating, Bloody Stool, Change in Bowel Habits, Chronic diarrhea, Constipation, Difficulty Swallowing, Excessive gas, Gets full quickly at meals, Hemorrhoids, Indigestion and Rectal Pain. Female Genitourinary Not Present- Frequency, Nocturia, Painful Urination, Pelvic Pain and Urgency. Musculoskeletal Present- Back Pain. Not Present- Joint Pain, Joint Stiffness, Muscle Pain, Muscle Weakness and Swelling of Extremities. Neurological Present- Decreased Memory, Trouble walking and Weakness. Not Present- Fainting, Headaches, Numbness, Seizures, Tingling and Tremor. Psychiatric Present- Change in Sleep Pattern, Depression and Fearful. Not Present- Anxiety, Bipolar and Frequent crying. Endocrine Not Present- Cold Intolerance, Excessive Hunger, Hair Changes, Heat Intolerance, Hot flashes and New Diabetes. Hematology Not Present- Blood Thinners, Easy Bruising, Excessive bleeding, Gland problems, HIV and Persistent Infections.  Vitals (Sade Bradford CMA; 06/10/2016 2:33 PM) 06/10/2016 2:32 PM Weight: 132.2 lb (59th percentile) Height: 62in (18th percentile) Body Surface Area: 1.6 m Body Mass Index: 24.18 kg/m  (74th percentile)  Temp.: 98.1F  Pulse: 112 (Regular)  BP:  112/62 (Sitting, Left Arm, Standard)  Percentiles calculated using CDC data for children 2-20 years.     Physical Exam (Samarie Pinder MD; 06/10/2016 3:16 PM) General Mental Status-Alert. General Appearance-Consistent with stated age. Hydration-Well hydrated. Voice-Normal.  Head and Neck Head-normocephalic, atraumatic with no lesions or palpable masses.  Eye Eyeball -   Bilateral-Extraocular movements intact. Sclera/Conjunctiva - Bilateral-No scleral icterus.  Chest and Lung Exam Chest and lung exam reveals -quiet, even and easy respiratory effort with no use of accessory muscles. Inspection Chest Wall - Normal. Back - normal.  Cardiovascular Cardiovascular examination reveals -normal heart sounds, regular rate and rhythm with no murmurs.  Abdomen Inspection Normal Exam - No Hernias. Palpation/Percussion Normal exam - Soft, No Rebound tenderness, No Rigidity (guarding) and No hepatosplenomegaly. Tenderness - Right Upper Quadrant(With a fullness sensation to the right upper quadrant). Auscultation Normal exam - Bowel sounds normal.  Neurologic Neurologic evaluation reveals -alert and oriented x 3 with no impairment of recent or remote memory. Mental Status-Normal.  Musculoskeletal Normal Exam - Left-Upper Extremity Strength Normal and Lower Extremity Strength Normal. Normal Exam - Right-Upper Extremity Strength Normal, Lower Extremity Weakness.    Assessment & Plan Axel Filler(Rakeb Kibble MD; 06/10/2016 3:17 PM) ACUTE CHOLECYSTITIS (K81.0) Impression: 20 year old female with likely acute cholecystitis, cholelithiasis  1. We'll plan on laparoscopic cholecystectomy +/- IOC 2. I spoke with Dr. Carolynne Edouardoth in regards to admission, proceeding with surgery.

## 2016-06-11 NOTE — Interval H&P Note (Signed)
History and Physical Interval Note:  06/11/2016 10:27 AM  Cassandra Ramirez  has presented today for surgery, with the diagnosis of gallstones  The various methods of treatment have been discussed with the patient and family. After consideration of risks, benefits and other options for treatment, the patient has consented to  Procedure(s): LAPAROSCOPIC CHOLECYSTECTOMY WITH INTRAOPERATIVE CHOLANGIOGRAM (N/A) as a surgical intervention .  The patient's history has been reviewed, patient examined, no change in status, stable for surgery.  I have reviewed the patient's chart and labs.  Questions were answered to the patient's satisfaction.     TOTH III,Brooklynne Pereida S

## 2016-06-11 NOTE — Op Note (Signed)
06/10/2016 - 06/11/2016  12:23 PM  PATIENT:  Cassandra Ramirez  20 y.o. female  PRE-OPERATIVE DIAGNOSIS:  Cholecystitis with cholelithiasis  POST-OPERATIVE DIAGNOSIS:  Cholecystitis with cholelithiasis  PROCEDURE:  Procedure(s): LAPAROSCOPIC CHOLECYSTECTOMY WITH INTRAOPERATIVE CHOLANGIOGRAM (N/A)  SURGEON:  Surgeon(s) and Role:    * Griselda MinerPaul Toth III, MD - Primary  PHYSICIAN ASSISTANT:   ASSISTANTS: none   ANESTHESIA:   general  EBL:  Total I/O In: 1000 [I.V.:1000] Out: 25 [Blood:25]  BLOOD ADMINISTERED:none  DRAINS: (1) Jackson-Pratt drain(s) with closed bulb suction in the gallbladder bed   LOCAL MEDICATIONS USED:  MARCAINE     SPECIMEN:  Source of Specimen:  gallbladder  DISPOSITION OF SPECIMEN:  PATHOLOGY  COUNTS:  YES  TOURNIQUET:  * No tourniquets in log *  DICTATION: .Dragon Dictation   Procedure: After informed consent was obtained the patient was brought to the operating room and placed in the supine position on the operating room table. After adequate induction of general anesthesia the patient's abdomen was prepped with ChloraPrep allowed to dry and draped in usual sterile manner. An appropriate timeout was performed. The area below the umbilicus was infiltrated with quarter percent  Marcaine. A small incision was made with a 15 blade knife. The incision was carried down through the subcutaneous tissue bluntly with a hemostat and Army-Navy retractors. The linea alba was identified. The linea alba was incised with a 15 blade knife and each side was grasped with Coker clamps. The preperitoneal space was then probed with a hemostat until the peritoneum was opened and access was gained to the abdominal cavity. A 0 Vicryl pursestring stitch was placed in the fascia surrounding the opening. A Hassan cannula was then placed through the opening and anchored in place with the previously placed Vicryl purse string stitch. The abdomen was insufflated with carbon dioxide without  difficulty. A [facility]oscope [facility] in the right upper quadrant was inspected. Next the epigastric region was infiltrated with % Marcaine. A small incision was made with a 15 blade knife. A 5 mm port was placed bluntly through this incision into the abdominal cavity under direct vision. Next 2 sites were chosen laterally on the right side of the abdomen for placement of 5 mm ports. Each of these areas was infiltrated with quarter percent Marcaine. Small stab incisions were made with a 15 blade knife. 5 mm ports were then placed bluntly through these incisions into the abdominal cavity under direct vision without difficulty. The gallbladder was very inflamed with areas of necrosis of the wall. The gallbladder had to be aspirated so it coule be grasped. A blunt grasper was placed through the lateralmost 5 mm port and used to grasp the dome of the gallbladder and elevated anteriorly and superiorly. Another blunt grasper was placed through the other 5 mm port and used to retract the body and neck of the gallbladder. A dissector was placed through the epigastric port and using the electrocautery the peritoneal reflection at the gallbladder neck was opened. Blunt dissection was then carried out in this area until the gallbladder neck-cystic duct junction was readily identified and a good window was created. A single clip was placed on the gallbladder neck. A small  ductotomy was made just below the clip with laparoscopic scissors. A 14-gauge Angiocath was then placed through the anterior abdominal wall under direct vision. A Reddick cholangiogram catheter was then placed through the Angiocath and flushed. The catheter was then placed in the cystic duct and anchored  in place with a clip. A cholangiogram was obtained that showed no filling defects good emptying into the duodenum an adequate length on the cystic duct. The anchoring clip and catheters were then removed from the patient. 3 clips  were placed proximally on the cystic duct and the duct was divided between the 2 sets of clips. Posterior to this the cystic artery was identified and again dissected bluntly in a circumferential manner until a good window  was created. 2 clips were placed proximally and one distally on the artery and the artery was divided between the 2 sets of clips. Next a laparoscopic hook cautery device was used to separate the gallbladder from the liver bed. Prior to completely detaching the gallbladder from the liver bed the liver bed was inspected and several small bleeding points were coagulated with the electrocautery until the area was completely hemostatic. The gallbladder was then detached the rest of it from the liver bed without difficulty. A laparoscopic bag was inserted through the hassan port. The laparoscope was moved to the epigastric port. The gallbladder was placed within the bag and the bag was sealed.  The bag with the gallbladder was then removed with the [facility] through the infraumbilical port without difficulty. A drain was also brought through the epigastric port site and out the lateral most port site. The drain was placed in the gallbladder bed and anchored to the skin with a 3-0 nylon stitch. The fascial defect was then closed with the previously placed Vicryl pursestring stitch as well as with another figure-of-eight 0 Vicryl stitch. The liver bed was inspected again and found to be hemostatic. The abdomen was irrigated with copious amounts of saline until the effluent was clear. The ports were then removed under direct vision without difficulty and were found to be hemostatic. The gas was allowed to escape. The skin incisions were all closed with interrupted 4-0 Monocryl subcuticular stitches. Dermabond dressings were applied. The patient tolerated the procedure well. At the end of the case all needle sponge and instrument counts were correct. The patient was then awakened and taken to  recovery in stable condition  PLAN OF CARE: Admit to inpatient   PATIENT DISPOSITION:  PACU - hemodynamically stable.   Delay start of Pharmacological VTE agent (>24hrs) due to surgical blood loss or risk of bleeding: no

## 2016-06-11 NOTE — Transfer of Care (Signed)
Immediate Anesthesia Transfer of Care Note  Patient: Cassandra PartridgeRachel Ramirez  Procedure(s) Performed: Procedure(s): LAPAROSCOPIC CHOLECYSTECTOMY WITH INTRAOPERATIVE CHOLANGIOGRAM (N/A)  Patient Location: PACU  Anesthesia Type:General  Level of Consciousness: sedated  Airway & Oxygen Therapy: Patient Spontanous Breathing and Patient connected to face mask oxygen  Post-op Assessment: Report given to RN and Post -op Vital signs reviewed and stable  Post vital signs: Reviewed and stable  Last Vitals:  Vitals:   06/11/16 0600 06/11/16 1231  BP: 115/72 126/73  Pulse: (!) 108   Resp:    Temp: 37.6 C     Last Pain:  Vitals:   06/11/16 0920  TempSrc:   PainSc: 0-No pain      Patients Stated Pain Goal: 2 (06/11/16 0844)  Complications: No apparent anesthesia complications

## 2016-06-11 NOTE — Anesthesia Preprocedure Evaluation (Addendum)
Anesthesia Evaluation  Patient identified by MRN, date of birth, ID band Patient awake    Reviewed: Allergy & Precautions, NPO status , Patient's Chart, lab work & pertinent test results  Airway Mallampati: II  TM Distance: >3 FB Neck ROM: Full    Dental  (+) Poor Dentition, Dental Advisory Given   Pulmonary neg pulmonary ROS,    breath sounds clear to auscultation       Cardiovascular negative cardio ROS   Rhythm:Regular Rate:Normal     Neuro/Psych Anxiety Depression negative neurological ROS     GI/Hepatic Neg liver ROS, GERD  Controlled and Medicated,Symptomatic cholelithiasis   Endo/Other  negative endocrine ROS  Renal/GU negative Renal ROS     Musculoskeletal   Abdominal   Peds  Hematology negative hematology ROS (+)   Anesthesia Other Findings   Reproductive/Obstetrics                            Anesthesia Physical Anesthesia Plan  ASA: I  Anesthesia Plan: General   Post-op Pain Management:    Induction: Intravenous  Airway Management Planned: Oral ETT  Additional Equipment:   Intra-op Plan:   Post-operative Plan: Extubation in OR  Informed Consent: I have reviewed the patients History and Physical, chart, labs and discussed the procedure including the risks, benefits and alternatives for the proposed anesthesia with the patient or authorized representative who has indicated his/her understanding and acceptance.   Dental advisory given  Plan Discussed with: CRNA and Surgeon  Anesthesia Plan Comments: (Plan routine monitors, GETA)        Anesthesia Quick Evaluation

## 2016-06-12 DIAGNOSIS — Z833 Family history of diabetes mellitus: Secondary | ICD-10-CM | POA: Diagnosis not present

## 2016-06-12 DIAGNOSIS — F329 Major depressive disorder, single episode, unspecified: Secondary | ICD-10-CM | POA: Diagnosis present

## 2016-06-12 DIAGNOSIS — K810 Acute cholecystitis: Secondary | ICD-10-CM | POA: Diagnosis present

## 2016-06-12 DIAGNOSIS — Z8249 Family history of ischemic heart disease and other diseases of the circulatory system: Secondary | ICD-10-CM | POA: Diagnosis not present

## 2016-06-12 DIAGNOSIS — F419 Anxiety disorder, unspecified: Secondary | ICD-10-CM | POA: Diagnosis present

## 2016-06-12 DIAGNOSIS — K8000 Calculus of gallbladder with acute cholecystitis without obstruction: Secondary | ICD-10-CM | POA: Diagnosis present

## 2016-06-12 DIAGNOSIS — R1011 Right upper quadrant pain: Secondary | ICD-10-CM | POA: Diagnosis present

## 2016-06-12 DIAGNOSIS — M549 Dorsalgia, unspecified: Secondary | ICD-10-CM | POA: Diagnosis present

## 2016-06-12 MED ORDER — HEPARIN SODIUM (PORCINE) 5000 UNIT/ML IJ SOLN
5000.0000 [IU] | Freq: Three times a day (TID) | INTRAMUSCULAR | Status: DC
  Administered 2016-06-12 – 2016-06-13 (×3): 5000 [IU] via SUBCUTANEOUS
  Filled 2016-06-12 (×4): qty 1

## 2016-06-12 MED ORDER — ENOXAPARIN SODIUM 40 MG/0.4ML ~~LOC~~ SOLN: 40.0000 mg | SUBCUTANEOUS | Status: DC

## 2016-06-12 NOTE — Progress Notes (Signed)
[facility] Progress Note  1 Day Post-Op  Subjective: Abdominal pain improved compared to before surgery. Tolerating PO without N/V. Ambulating to bathroom. +flatus. Denies BM.  Objective: Vital signs in last 24 hours: Temp:  [97.9 F (36.6 C)-98.7 F (37.1 C)] 97.9 F (36.6 C) (11/09 0439) Pulse Rate:  [59-108] 59 (11/09 0439) Resp:  [14-22] 17 (11/09 0439) BP: (92-126)/(55-80) 92/55 (11/09 0439) SpO2:  [97 %-100 %] 97 % (11/09 0439) Last BM Date: 06/08/16  Intake/Output from previous day: 11/08 0701 - 11/09 0700 In: 5580 [I.V.:5330; IV Piggyback:250] Out: 1300 [Urine:1150; Drains:125; Blood:25] Intake/Output this shift: No intake/output data recorded.  PE: Gen:  Alert, NAD, pleasant Card:  RRR, no M/G/R heard Pulm:  CTA, no W/R/R Abd: Soft, appropriately tender, ND, +BS,incisions C/D/I, drain with sanguinous drainage  Drain 125 cc/24h  Lab Results:  No results for input(s): WBC, HGB, HCT, PLT in the last 72 hours. BMET No results for input(s): NA, K, CL, CO2, GLUCOSE, BUN, CREATININE, CALCIUM in the last 72 hours. PT/INR No results for input(s): LABPROT, INR in the last 72 hours. CMP     Component Value Date/Time   NA 138 06/08/2016 1725   K 3.6 06/08/2016 1725   CL 103 06/08/2016 1725   CO2 25 06/08/2016 1725   GLUCOSE 119 (H) 06/08/2016 1725   BUN 10 06/08/2016 1725   CREATININE 0.83 06/08/2016 1725   CALCIUM 9.4 06/08/2016 1725   PROT 8.8 (H) 06/08/2016 1725   ALBUMIN 4.6 06/08/2016 1725   AST 17 06/08/2016 1725   ALT 16 06/08/2016 1725   ALKPHOS 66 06/08/2016 1725   BILITOT 0.9 06/08/2016 1725   GFRNONAA >60 06/08/2016 1725   GFRAA >60 06/08/2016 1725   Lipase     Component Value Date/Time   LIPASE 15 06/08/2016 1725       Studies/Results: Dg Cholangiogram Operative  Result Date: 06/11/2016 CLINICAL DATA:  Cholelithiasis. EXAM: INTRAOPERATIVE CHOLANGIOGRAM TECHNIQUE: Cholangiographic images from the C-arm fluoroscopic device  were submitted for interpretation post-operatively. Please see the procedural report for the amount of contrast and the fluoroscopy time utilized. COMPARISON:  Ultrasound 06/08/2016 FINDINGS: Opacification of the intrahepatic and extrahepatic biliary system. Biliary system is not dilated. Contrast drains into the duodenum. No obstructing lesions or stones. IMPRESSION: Patent biliary system.  Normal intraoperative cholangiogram. Electronically Signed   By: Richarda OverlieAdam  Henn M.D.   On: 06/11/2016 12:20    Anti-infectives: Anti-infectives    Start     Dose/Rate Route Frequency Ordered Stop   06/11/16 0600  ceFAZolin (ANCEF) IVPB 2g/100 mL premix  Status:  Discontinued     2 g 200 mL/hr over 30 Minutes Intravenous On call to O.R. 06/10/16 1633 06/10/16 1637   06/10/16 1700  piperacillin-tazobactam (ZOSYN) IVPB 3.375 g     3.375 g 12.5 mL/hr over 240 Minutes Intravenous Every 8 hours 06/10/16 1637       Assessment/Plan Acute calculous cholecystitis  S/p laparoscopic cholecystectomy, placement of drain; Dr. Carolynne Edouardoth 06/11/16  POD#1  Continue IV zosyn  Drain: 125 cc/24h,   FEN: regular diet, decrease IVF  ID: Zosyn 11/7 >>  VTE: heparin [place], SCD's  Dispo: pain control, abx, monitor drain Possible discharge tomorrow.      LOS: 0 days    Adam PhenixElizabeth S Orville Mena , [facility] 06/12/2016, 9:47 AM Pager: [phone] Consults: [phone] Mon-Fri 7:00 am-4:30 pm Sat-Sun 7:00 am-11:30 am

## 2016-06-13 ENCOUNTER — Encounter (INDEPENDENT_AMBULATORY_CARE_PROVIDER_SITE_OTHER): Payer: Self-pay | Admitting: General Surgery

## 2016-06-13 LAB — CBC
HCT: 39.3 % (ref 36.0–46.0)
Hemoglobin: 12.9 g/dL (ref 12.0–15.0)
MCH: 29.6 pg (ref 26.0–34.0)
MCHC: 32.8 g/dL (ref 30.0–36.0)
MCV: 90.1 fL (ref 78.0–100.0)
PLATELETS: 380 10*3/uL (ref 150–400)
RBC: 4.36 MIL/uL (ref 3.87–5.11)
RDW: 13.8 % (ref 11.5–15.5)
WBC: 8.7 10*3/uL (ref 4.0–10.5)

## 2016-06-13 MED ORDER — HYDROCODONE-ACETAMINOPHEN 5-325 MG PO TABS: 1.0000 | ORAL_TABLET | ORAL | 0 refills | Status: AC | PRN

## 2016-06-13 NOTE — Discharge Summary (Signed)
[facility] Discharge Summary   Patient ID: Cassandra PartridgeRachel Ramirez MRN: [id number] DOB/AGE: 19/11/1995 19 y.o.  Admit date: 06/10/2016 Discharge date: 06/13/2016  Admitting Diagnosis: Acute cholecystitis   Discharge Diagnosis Patient Active Problem List   Diagnosis Date Noted  . Acute cholecystitis 06/12/2016    Consultants None   Imaging: 06/11/16 DG cholangiogram operative - normal. Patent biliary system.   Procedures Dr. Chevis PrettyPaul Toth III (06/11/16) - Laparoscopic Cholecystectomy with IOC, placement of RUQ drain.   Hospital Course:  20 year-old female who presented to [name] from the [name] with a 3 days history of worsening right upper quadrant pain, recently seen in the ED with nausea, vomiting, and WBC 19. Patient was admitted and underwent procedure listed above. During the operation the gallbladder was noted to be very inflamed with areas of necrosis. The patient tolerated procedure well and was transferred to the floor.  Diet was advanced as tolerated.  On POD#2, the patient was voiding well, tolerating diet, ambulating well, pain well controlled, vital signs stable, leukocytosis resolved, incisions c/d/i and felt stable for discharge home. She will be discharged home with her drain in place.  Patient will follow up in our office in 1 week and knows to call with questions or concerns.       Medication List    STOP taking these medications   ondansetron 4 MG disintegrating tablet Commonly known as:  ZOFRAN ODT   ondansetron 4 MG tablet Commonly known as:  ZOFRAN   oxyCODONE-acetaminophen 5-325 MG tablet Commonly known as:  PERCOCET/ROXICET     TAKE these medications   HYDROcodone-acetaminophen 5-325 MG tablet Commonly known as:  NORCO/VICODIN Take 1-2 tablets by mouth every 4 (four) hours as needed for moderate pain or severe pain.   ORSYTHIA 0.1-20 MG-MCG tablet Generic drug:  levonorgestrel-ethinyl estradiol Take 1 tablet by mouth  daily.      Follow-up Information    TOTH III,PAUL S, MD. Schedule an appointment as soon as possible for a visit in 1 week(s).   Specialty:  General Surgery Why:  for post-operative follow up Contact information: [address] [place] [address] [phone]          Signed: Hosie SpangleElizabeth Simaan, [facility] 06/13/2016, 1:10 PM Pager: [phone] Consults: [phone] Mon-Fri 7:00 am-4:30 pm Sat-Sun 7:00 am-11:30 am

## 2016-06-13 NOTE — Progress Notes (Signed)
DC instructions reviewed with the pt and her parents. JP drain care and instructions also reviewed at the bedside with pt. Pt declined to have HHRN come to her home for teaching. All questions and concerns were addressed. Pt stated understanding of how to care for JP drain and when to call the MD if needed. All surgical incision sites are unremarkable- clean, dry, and intact, all other skin is intact, VSS, pain is controlled, no apparent s/s of distress or discomfort at this time. Pt DC home with her parents.

## 2016-06-13 NOTE — Discharge Instructions (Signed)
Please arrive at least 30 min before your appointment to complete your check in paperwork.  If you are unable to arrive 30 min prior to your appointment time we may have to cancel or reschedule you. ° °LAPAROSCOPIC SURGERY: POST OP INSTRUCTIONS  °1. DIET: Follow a light bland diet the first 24 hours after arrival home, such as soup, liquids, crackers, etc. Be sure to include lots of fluids daily. Avoid fast food or heavy meals as your are more likely to get nauseated. Eat a low fat the next few days after surgery.  °2. Take your usually prescribed home medications unless otherwise directed. °3. PAIN CONTROL:  °1. Pain is best controlled by a usual combination of three different methods TOGETHER:  °1. Ice/Heat °2. Over the counter pain medication °3. Prescription pain medication °2. Most patients will experience some swelling and bruising around the incisions. Ice packs or heating pads (30-60 minutes up to 6 times a day) will help. Use ice for the first few days to help decrease swelling and bruising, then switch to heat to help relax tight/sore spots and speed recovery. Some people prefer to use ice alone, heat alone, alternating between ice & heat. Experiment to what works for you. Swelling and bruising can take several weeks to resolve.  °3. It is helpful to take an over-the-counter pain medication regularly for the first few weeks. Choose one of the following that works best for you:  °1. Naproxen (Aleve, etc) Two 220mg tabs twice a day °2. Ibuprofen (Advil, etc) Three 200mg tabs four times a day (every meal & bedtime) °3. Acetaminophen (Tylenol, etc) 500-650mg four times a day (every meal & bedtime) °4. A prescription for pain medication (such as oxycodone, hydrocodone, etc) should be given to you upon discharge. Take your pain medication as prescribed.  °1. If you are having problems/concerns with the prescription medicine (does not control pain, nausea, vomiting, rash, itching, etc), please call us [phone]  [phone] to see if we need to switch you to a different pain medicine that will work better for you and/or control your side effect better. °2. If you need a refill on your pain medication, please contact your pharmacy. They will contact our office to request authorization. Prescriptions will not be filled after 5 pm or on week-ends. °4. Avoid getting constipated. Between the surgery and the pain medications, it is common to experience some constipation. Increasing fluid intake and taking a fiber supplement (such as Metamucil, Citrucel, FiberCon, MiraLax, etc) 1-2 times a day regularly will usually help prevent this problem from occurring. A mild laxative (prune juice, Milk of Magnesia, MiraLax, etc) should be taken according to package directions if there are no bowel movements after 48 hours.  °5. Watch out for diarrhea. If you have many loose bowel movements, simplify your diet to bland foods & liquids for a few days. Stop any stool softeners and decrease your fiber supplement. Switching to mild anti-diarrheal medications (Kayopectate, Pepto Bismol) can help. If this worsens or does not improve, please call us. °6. Wash / shower every day. You may shower over the dressings as they are waterproof. Continue to shower over incision(s) after the dressing is off. °7. Remove your waterproof bandages 5 days after surgery. You may leave the incision open to air. You may replace a dressing/Band-Aid to cover the incision for comfort if you wish.  °8. ACTIVITIES as tolerated:  °1. You may resume regular (light) daily activities beginning the next day--such as daily self-care, walking, climbing stairs--gradually   increasing activities as tolerated. If you can walk 30 minutes without difficulty, it is safe to try more intense activity such as jogging, treadmill, bicycling, low-impact aerobics, swimming, etc. 2. Save the most intensive and strenuous activity for last such as sit-ups, heavy lifting, contact sports, etc Refrain  from any heavy lifting or straining until you are off narcotics for pain control.  3. DO NOT PUSH THROUGH PAIN. Let pain be your guide: If it hurts to do something, don't do it. Pain is your body warning you to avoid that activity for another week until the pain goes down. 4. You may drive when you are no longer taking prescription pain medication, you can comfortably wear a seatbelt, and you can safely maneuver your car and apply brakes. 5. You may have sexual intercourse when it is comfortable.  9. FOLLOW UP in our office  1. Please call [facility] at [phone] to set up an appointment to see your surgeon in the office for a follow-up appointment approximately 2-3 weeks after your surgery. 2. Make sure that you call for this appointment the day you arrive home to insure a convenient appointment time.      10. IF YOU HAVE DISABILITY OR FAMILY LEAVE FORMS, BRING THEM TO THE               OFFICE FOR PROCESSING.   WHEN TO CALL US [phone]:  1. Poor pain control 2. Reactions / problems with new medications (rash/itching, nausea, etc)  3. Fever over 101.5 F (38.5 C) 4. Inability to urinate 5. Nausea and/or vomiting 6. Worsening swelling or bruising 7. Continued bleeding from incision. 8. Increased pain, redness, or drainage from the incision  The clinic staff is available to answer your questions during regular business hours (8:30am-5pm). Please dont hesitate to call and ask to speak to one of our nurses for clinical concerns.  If you have a medical emergency, go to the nearest emergency room or call 911.  [facility] always [facility] at the [facility]   [facility] [place]  [address], [address], [place], [place] [address] ?  MAIN: [phone] ? TOLL FREE: [phone] ?  FAX [phone]  [email or web address]  Percutaneous Drain, Care After  WHAT TO EXPECT AFTER THE PROCEDURE After your procedure, it is typical to have  the following:   A small amount of discomfort in the area where the drainage tube was placed.  A small amount of bruising around the area where the drainage tube was placed.  Sleepiness and fatigue for the rest of the day from the medicines used. HOME CARE INSTRUCTIONS -flush drain with 5mL NS in a syringe daily (and as needed) -record output daily with help of diary -wash around drain site with soap & water daily.  (Dilute hydrogen peroxide OK).  Cover drain with clean gauze & tape.  SEEK MEDICAL CARE IF:  You have increased bleeding (more than a small spot) from the site where the drainage tube was placed.  You have redness, swelling, or increasing pain around the site where the drainage tube was placed.  You notice a discharge or bad smell coming from the site where the drainage tube was placed.  You have a fever or chills.  You have pain that is not helped by medicine.  SEEK IMMEDIATE MEDICAL CARE IF:  There is leakage around the drainage tube.  The drainage tube pulls out.  You suddenly stop having drainage from  the tube.  You suddenly have blood in the drainage fluid.  You become dizzy or faint.  You develop a rash.   You have nausea or vomiting.  You have difficulty breathing, feel short of breath, or feel faint.   You develop chest pain.  You have problems with your speech or vision.  You have trouble balancing or moving your arms or legs.   This information is not intended to replace advice given to you by your health care provider. Make sure you discuss any questions you have with your health care provider.   Document Released: 12/05/2013 Document Revised: 05/09/2014 Document Reviewed: 12/05/2013 Elsevier Interactive Patient Education [employer]2016 [employer] Inc.

## 2016-06-13 NOTE — Progress Notes (Signed)
[facility] Progress Note  2 Days Post-Op  Subjective: Mild abdominal soreness. Tolerating regular diet without nausea/vomiting. Having flatus. Denies BM. Ambulating in her room.   Objective: Vital signs in last 24 hours: Temp:  [98.2 F (36.8 C)-98.8 F (37.1 C)] 98.5 F (36.9 C) (11/10 0809) Pulse Rate:  [60-90] 60 (11/10 0809) Resp:  [18] 18 (11/10 0809) BP: (96-114)/(51-75) 114/75 (11/10 0809) SpO2:  [98 %-100 %] 100 % (11/10 0809) Last BM Date: 06/08/16  Intake/Output from previous day: 11/09 0701 - 11/10 0700 In: 2251.7 [P.O.:480; I.V.:1621.7; IV Piggyback:150] Out: 30 [Drains:30] Intake/Output this shift: No intake/output data recorded.  PE: Gen:  Alert, NAD, pleasant Pulm:  CTA, no W/R/R Abd: Soft, appropriately tender, mild distention,+BS,incisions C/D/I, drain with minimal sero-sanguinous drainage  Drain: 30 cc/24h Ext:  No erythema, edema, or tenderness   CMP     Component Value Date/Time   NA 138 06/08/2016 1725   K 3.6 06/08/2016 1725   CL 103 06/08/2016 1725   CO2 25 06/08/2016 1725   GLUCOSE 119 (H) 06/08/2016 1725   BUN 10 06/08/2016 1725   CREATININE 0.83 06/08/2016 1725   CALCIUM 9.4 06/08/2016 1725   PROT 8.8 (H) 06/08/2016 1725   ALBUMIN 4.6 06/08/2016 1725   AST 17 06/08/2016 1725   ALT 16 06/08/2016 1725   ALKPHOS 66 06/08/2016 1725   BILITOT 0.9 06/08/2016 1725   GFRNONAA >60 06/08/2016 1725   GFRAA >60 06/08/2016 1725   Lipase     Component Value Date/Time   LIPASE 15 06/08/2016 1725   Studies/Results: Dg Cholangiogram Operative  Result Date: 06/11/2016 CLINICAL DATA:  Cholelithiasis. EXAM: INTRAOPERATIVE CHOLANGIOGRAM TECHNIQUE: Cholangiographic images from the C-arm fluoroscopic device were submitted for interpretation post-operatively. Please see the procedural report for the amount of contrast and the fluoroscopy time utilized. COMPARISON:  Ultrasound 06/08/2016 FINDINGS: Opacification of the intrahepatic and  extrahepatic biliary system. Biliary system is not dilated. Contrast drains into the duodenum. No obstructing lesions or stones. IMPRESSION: Patent biliary system.  Normal intraoperative cholangiogram. Electronically Signed   By: Richarda OverlieAdam  Henn M.D.   On: 06/11/2016 12:20    Anti-infectives: Anti-infectives    Start     Dose/Rate Route Frequency Ordered Stop   06/11/16 0600  ceFAZolin (ANCEF) IVPB 2g/100 mL premix  Status:  Discontinued     2 g 200 mL/hr over 30 Minutes Intravenous On call to O.R. 06/10/16 1633 06/10/16 1637   06/10/16 1700  piperacillin-tazobactam (ZOSYN) IVPB 3.375 g     3.375 g 12.5 mL/hr over 240 Minutes Intravenous Every 8 hours 06/10/16 1637       Assessment/Plan Acute calculous cholecystitis  S/p laparoscopic cholecystectomy, placement of drain; Dr. Carolynne Edouardoth 06/11/16             POD#2             Continue IV zosyn             Drain: 30 cc/24h,   FEN: regular diet ID: Zosyn 11/7 >>  VTE: heparin [facility], SCD's  Dispo: CBC pending, discharge today Will discuss removal of drain prior to discharge with MD    LOS: 1 day    Adam PhenixElizabeth S Simaan , [facility] 06/13/2016, 9:16 AM Pager: [phone] Consults: [phone] Mon-Fri 7:00 am-4:30 pm Sat-Sun 7:00 am-11:30 am

## 2016-09-25 ENCOUNTER — Other Ambulatory Visit: Payer: Self-pay | Admitting: Physician Assistant

## 2017-11-15 IMAGING — US US ABDOMEN LIMITED
1 series · 14 of 25 positions shown · non-contrast
Comparison: None.

CLINICAL DATA: Right upper quadrant pain, nausea and vomiting since
[DATE]

EXAM:
US ABDOMEN LIMITED - RIGHT UPPER QUADRANT

[Series 1: us abdomen limited · 0.19mm/px · 14 of 114 slices shown]
[im 1/114]
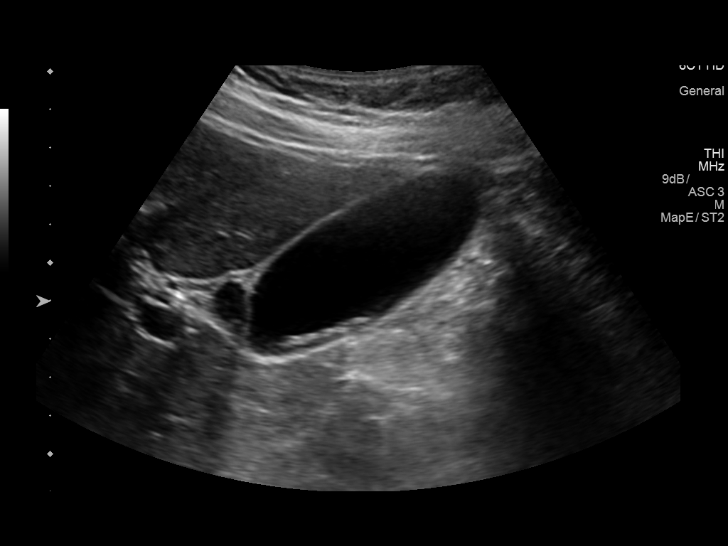
[im 10/114]
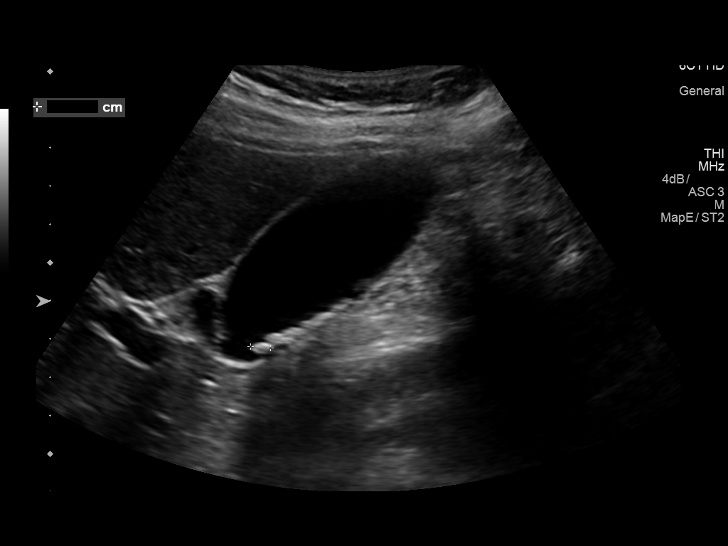
[im 19/114]
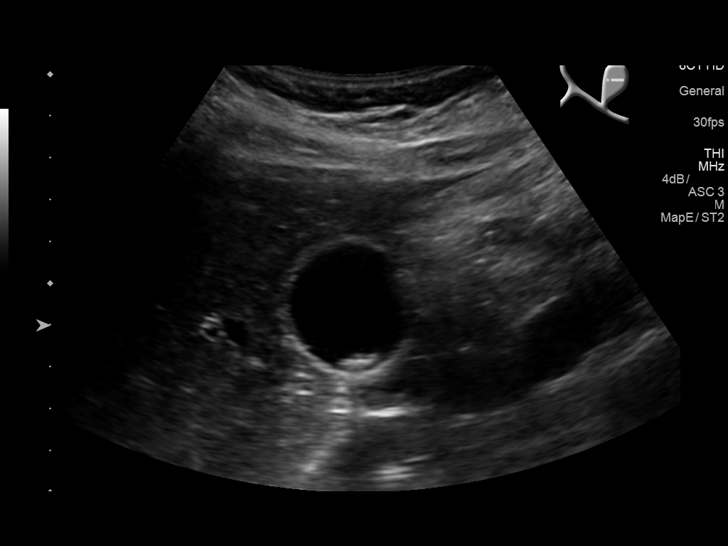
[im 29/114]
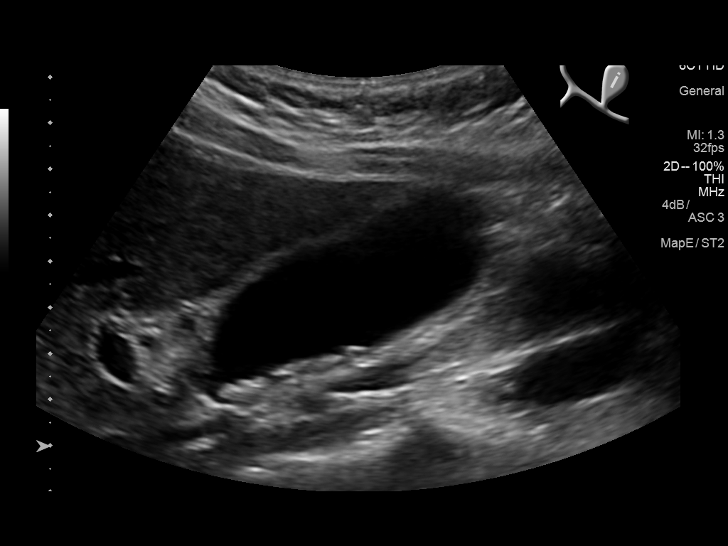
[im 38/114]
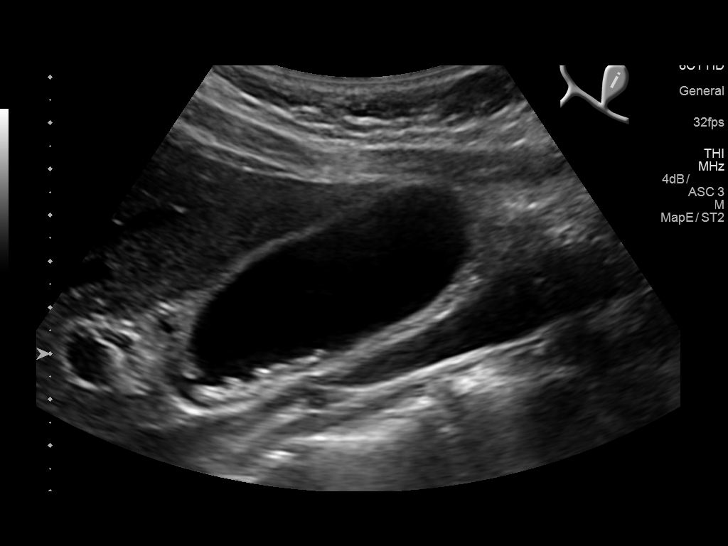
[im 43/114]
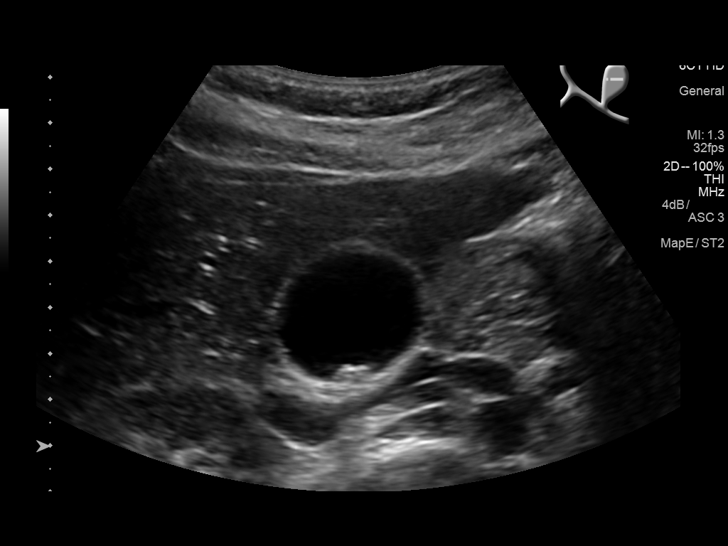
[im 52/114]
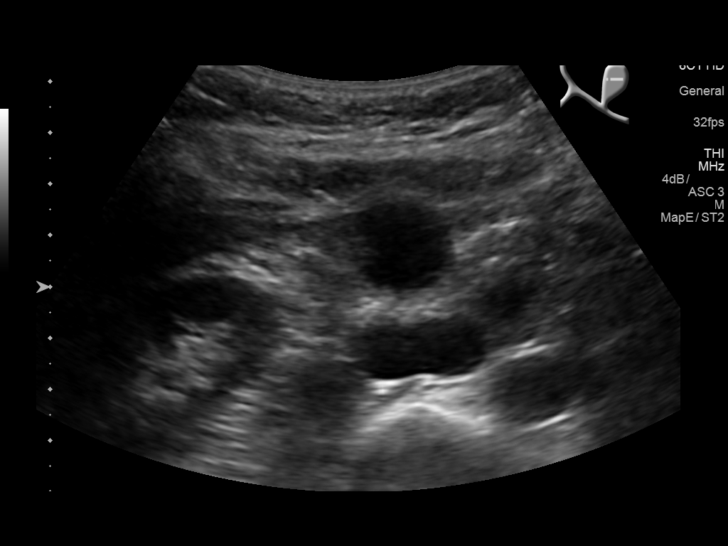
[im 62/114]
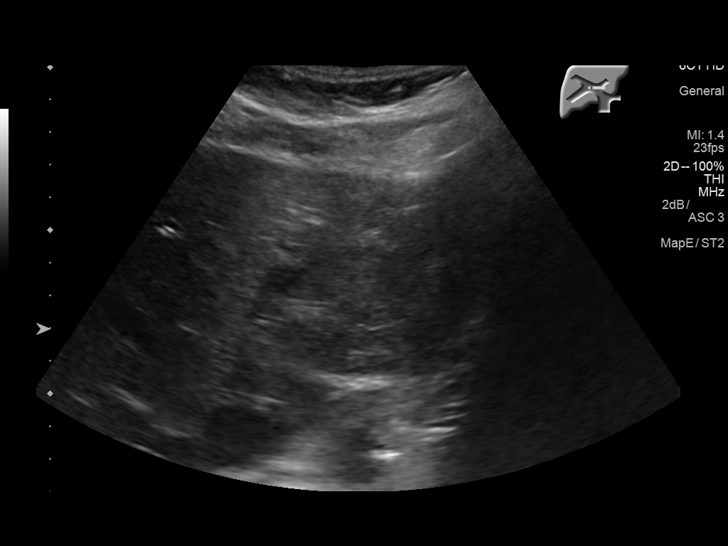
[im 71/114]
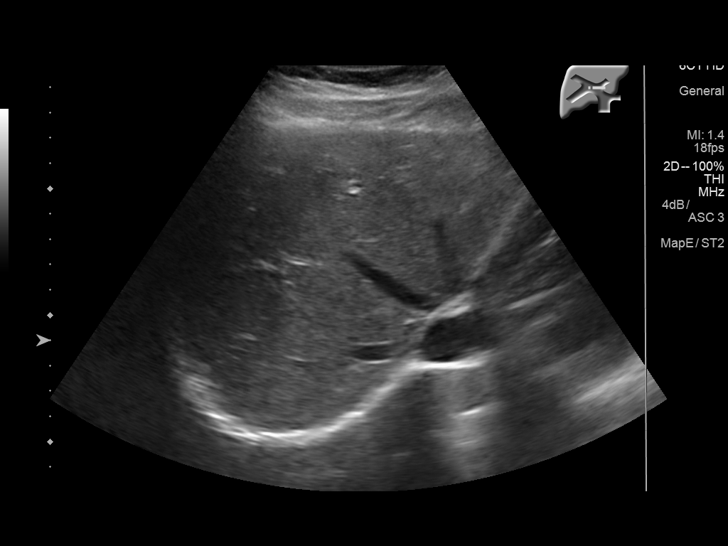
[im 76/114]
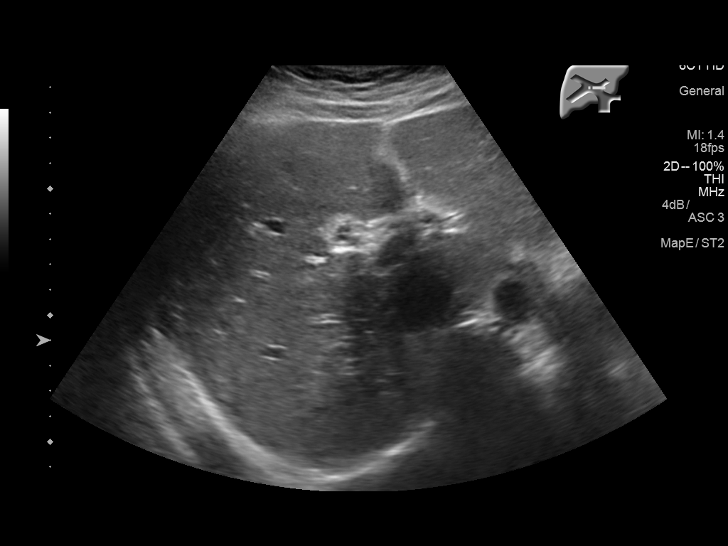
[im 85/114]
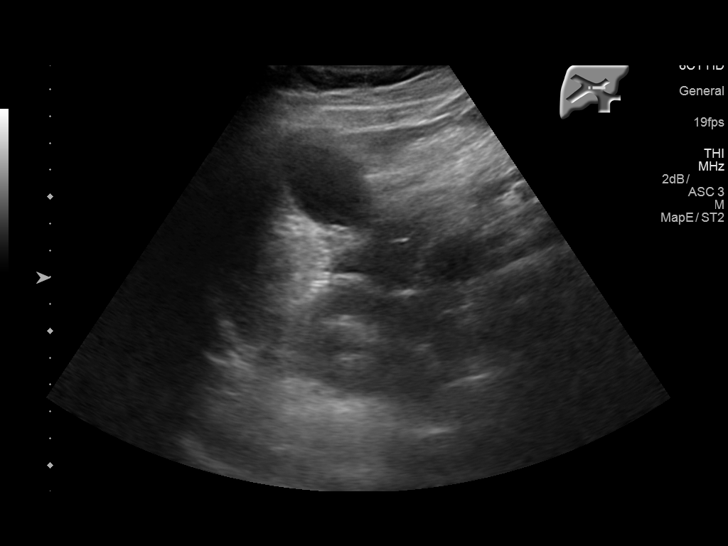
[im 95/114]
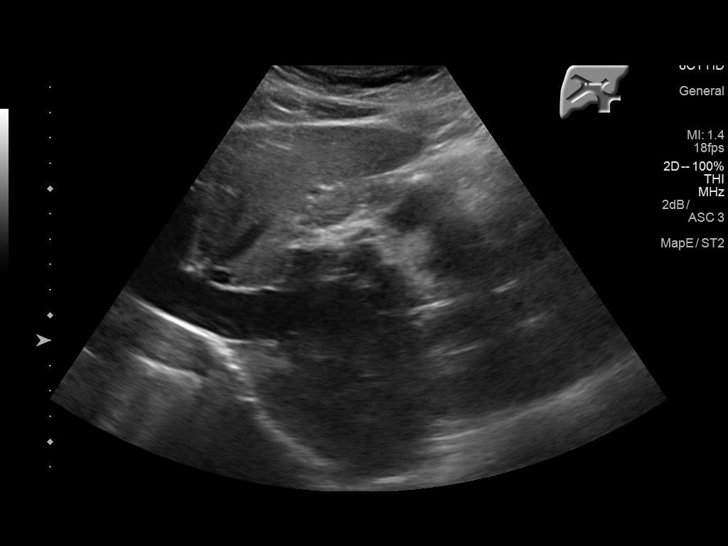
[im 104/114]
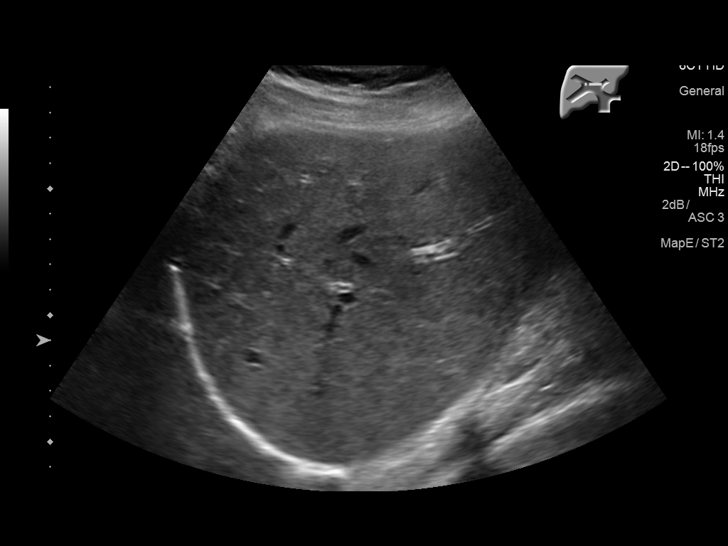
[im 114/114]
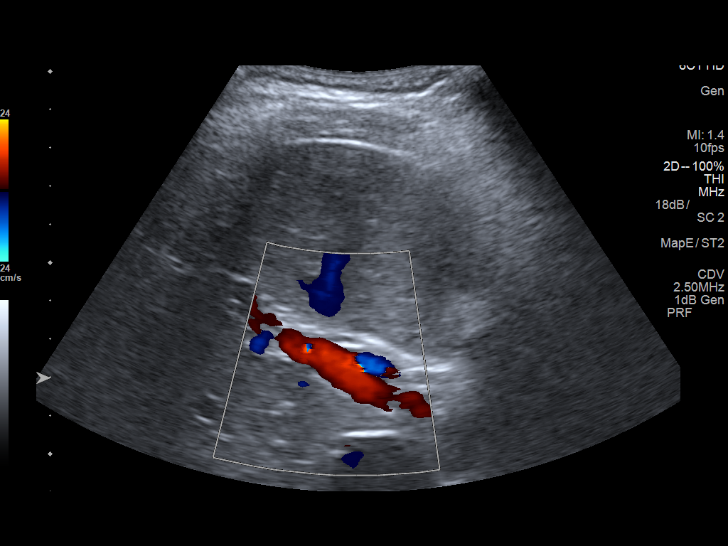

[14 of 25 positions shown; findings below may reference images not displayed]

FINDINGS: Gallbladder:

Numerous small calculi within the gallbladder lumen measuring up to
5 mm. The patient was not tender to probe pressure over the
gallbladder. There is no gallbladder wall thickening or
pericholecystic fluid.

Common bile duct:

Diameter: 3.4 mm

Liver:

No focal lesion identified. Within normal limits in parenchymal
echogenicity.
IMPRESSION: Cholelithiasis.  No sonographic evidence of acute cholecystitis.

## 2017-11-17 IMAGING — US US ABDOMEN LIMITED
1 series · 14 of 25 positions shown · non-contrast
Comparison: 06/06/2016

CLINICAL DATA: Worsening right upper quadrant abdominal pain for 3
days. Cholelithiasis.

EXAM:
US ABDOMEN LIMITED - RIGHT UPPER QUADRANT

[Series 1: us abdomen limited · 0.20mm/px · 14 of 68 slices shown]
[im 1/68]
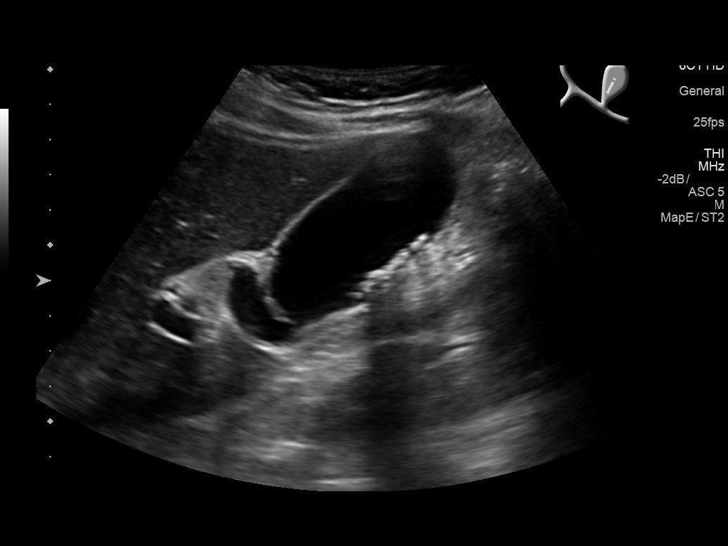
[im 6/68]
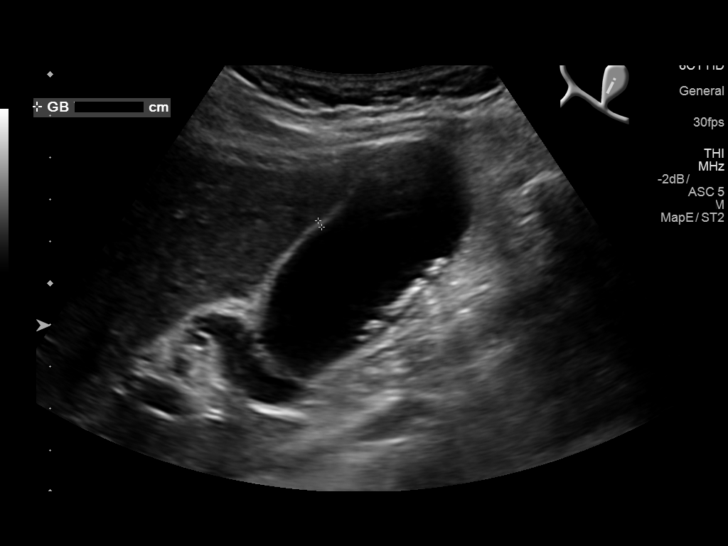
[im 12/68]
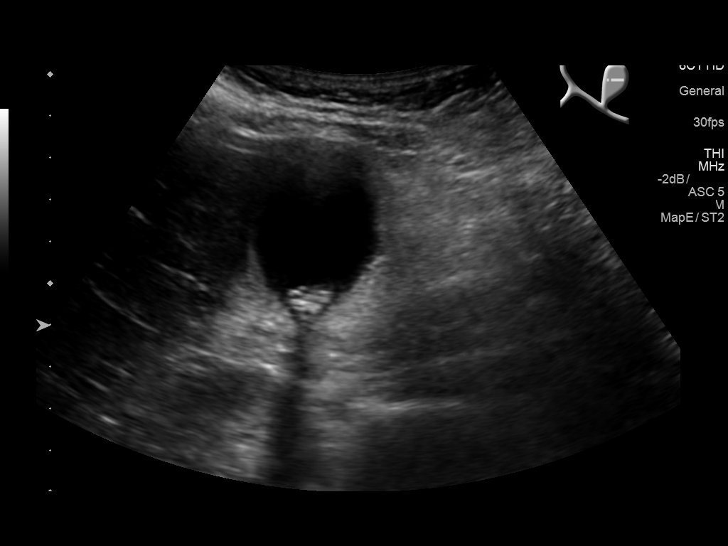
[im 17/68]
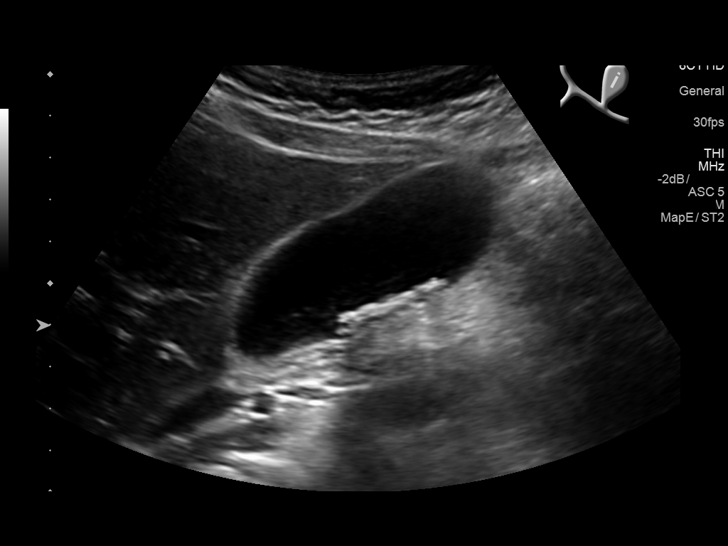
[im 23/68]
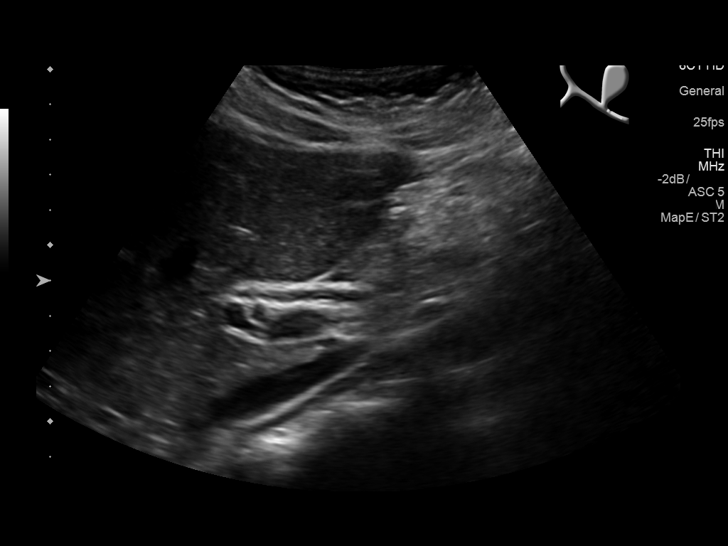
[im 26/68]
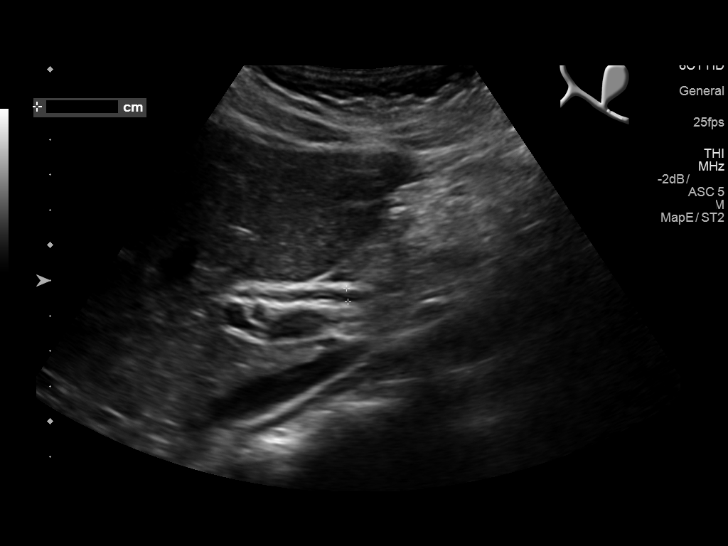
[im 31/68]
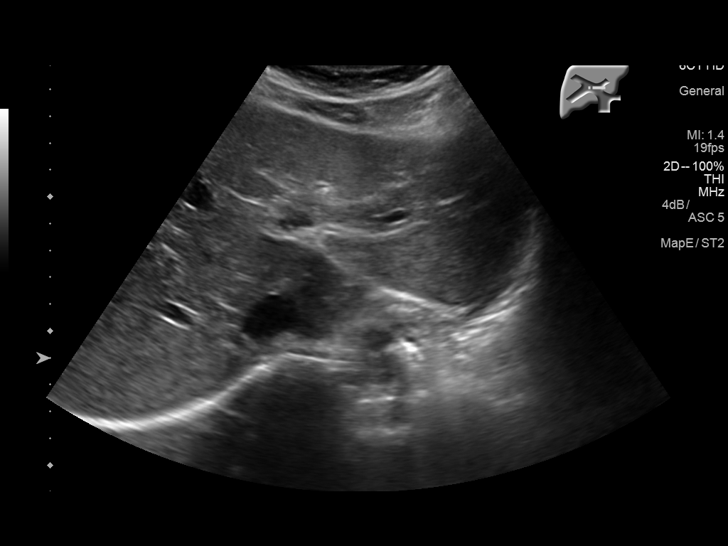
[im 37/68]
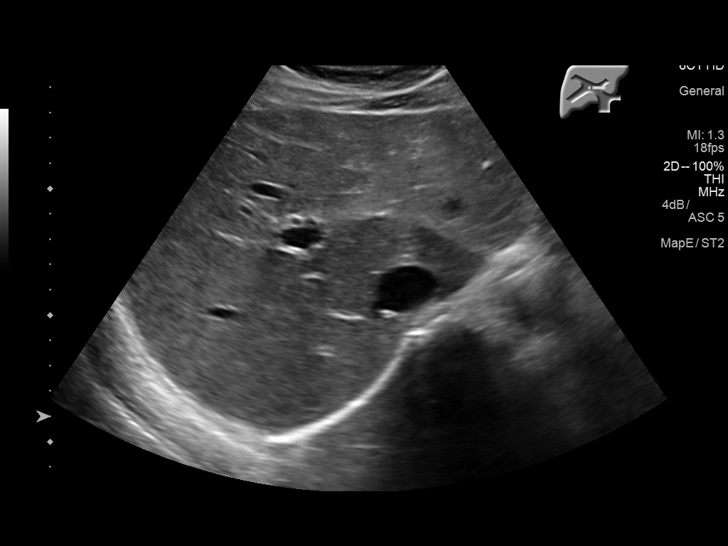
[im 42/68]
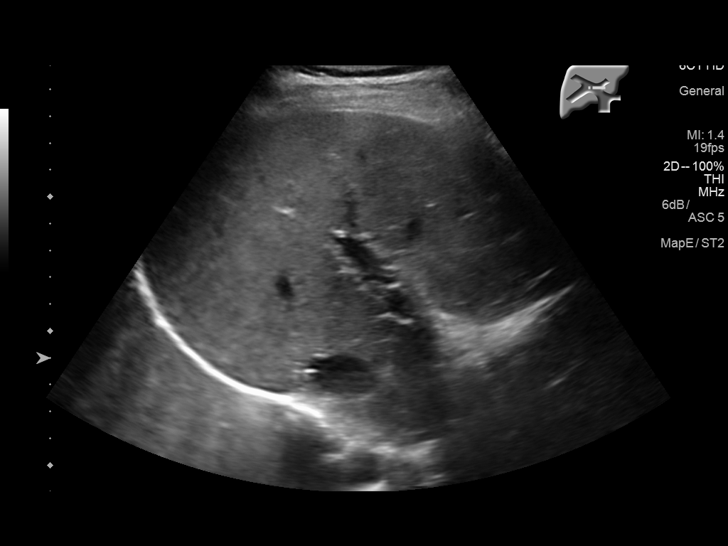
[im 45/68]
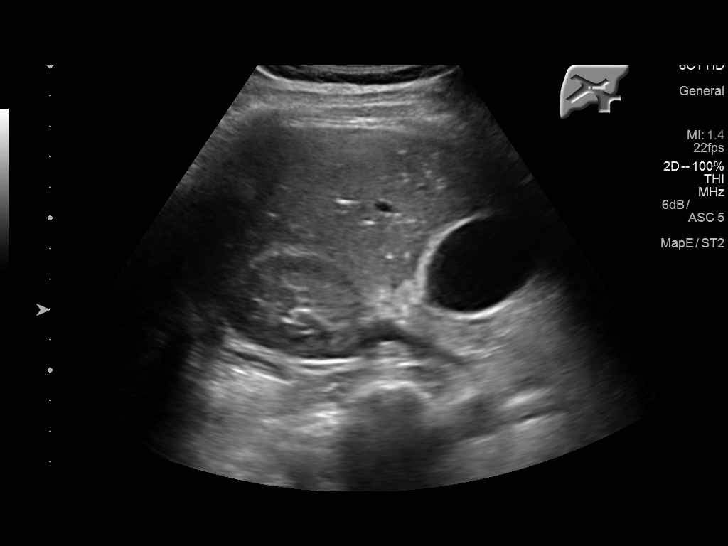
[im 51/68]
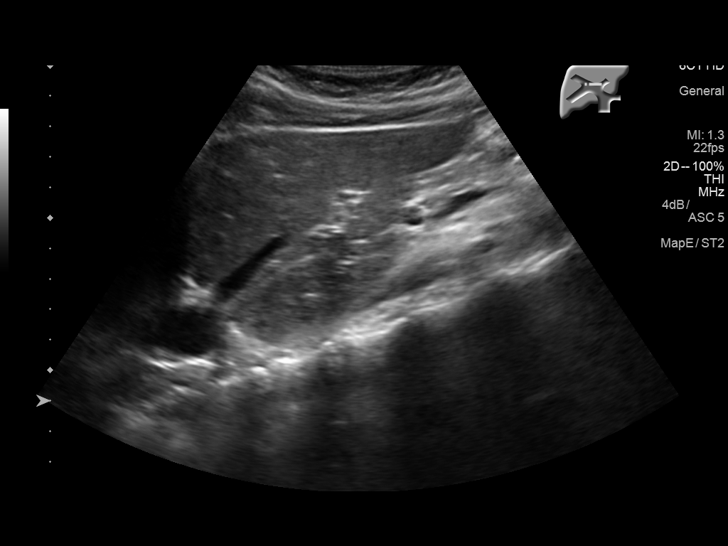
[im 56/68]
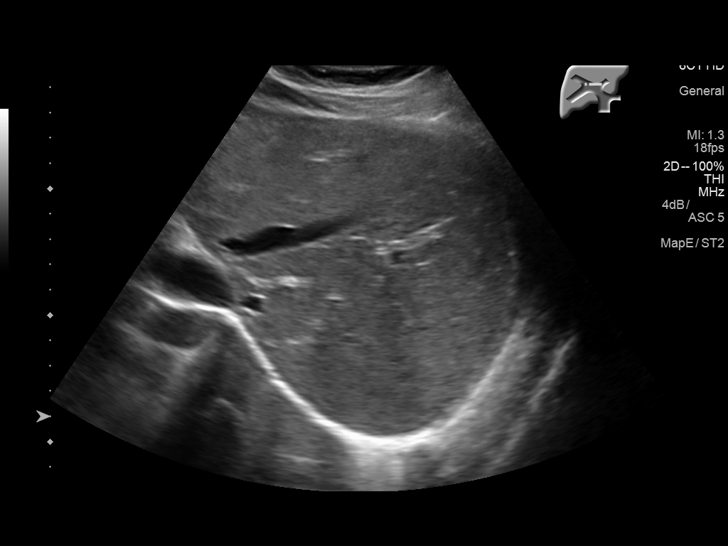
[im 62/68]
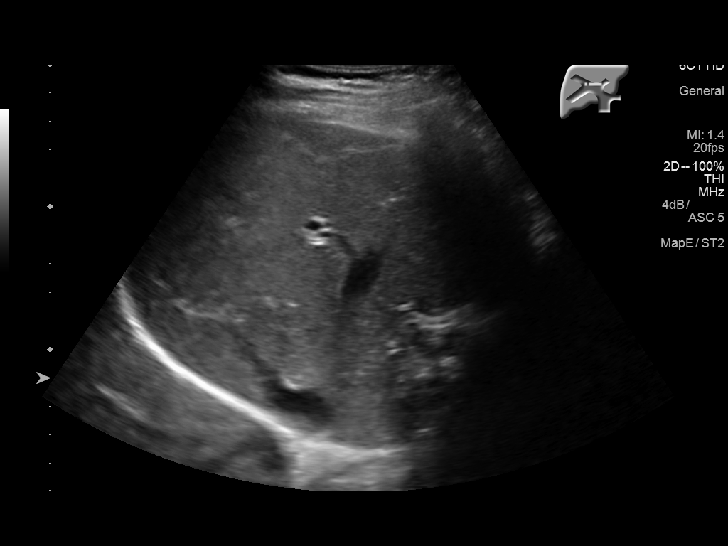
[im 68/68]
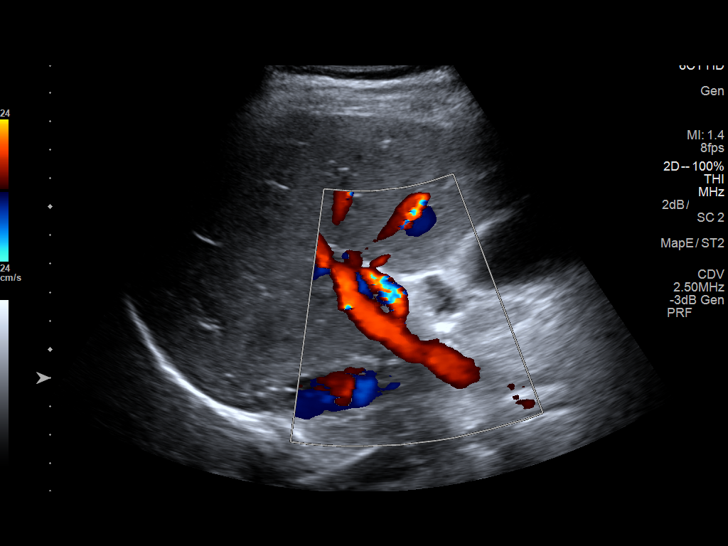

[14 of 25 positions shown; findings below may reference images not displayed]

FINDINGS: Gallbladder:

Multiple tiny gallstones are again seen measuring up to 4 mm. No
evidence of gallbladder wall thickening or pericholecystic fluid. No
sonographic Murphy sign noted by sonographer.

Common bile duct:

Diameter: 4 mm, within normal limits.

Liver:

No focal lesion identified. Within normal limits in parenchymal
echogenicity.
IMPRESSION: Cholelithiasis again noted. No sonographic signs of acute
cholecystitis or biliary dilatation.

## 2018-02-14 IMAGING — RF DG CHOLANGIOGRAM OPERATIVE
1 series · 4 of 4 positions shown · non-contrast
Comparison: Ultrasound 06/08/2016

CLINICAL DATA: Cholelithiasis.

EXAM:
INTRAOPERATIVE CHOLANGIOGRAM
TECHNIQUE: Cholangiographic images from the C-arm fluoroscopic device were
submitted for interpretation post-operatively. Please see the
procedural report for the amount of contrast and the fluoroscopy
time utilized.

[Series 1: run · 4 of 41 frames shown]
[frame 7/41]
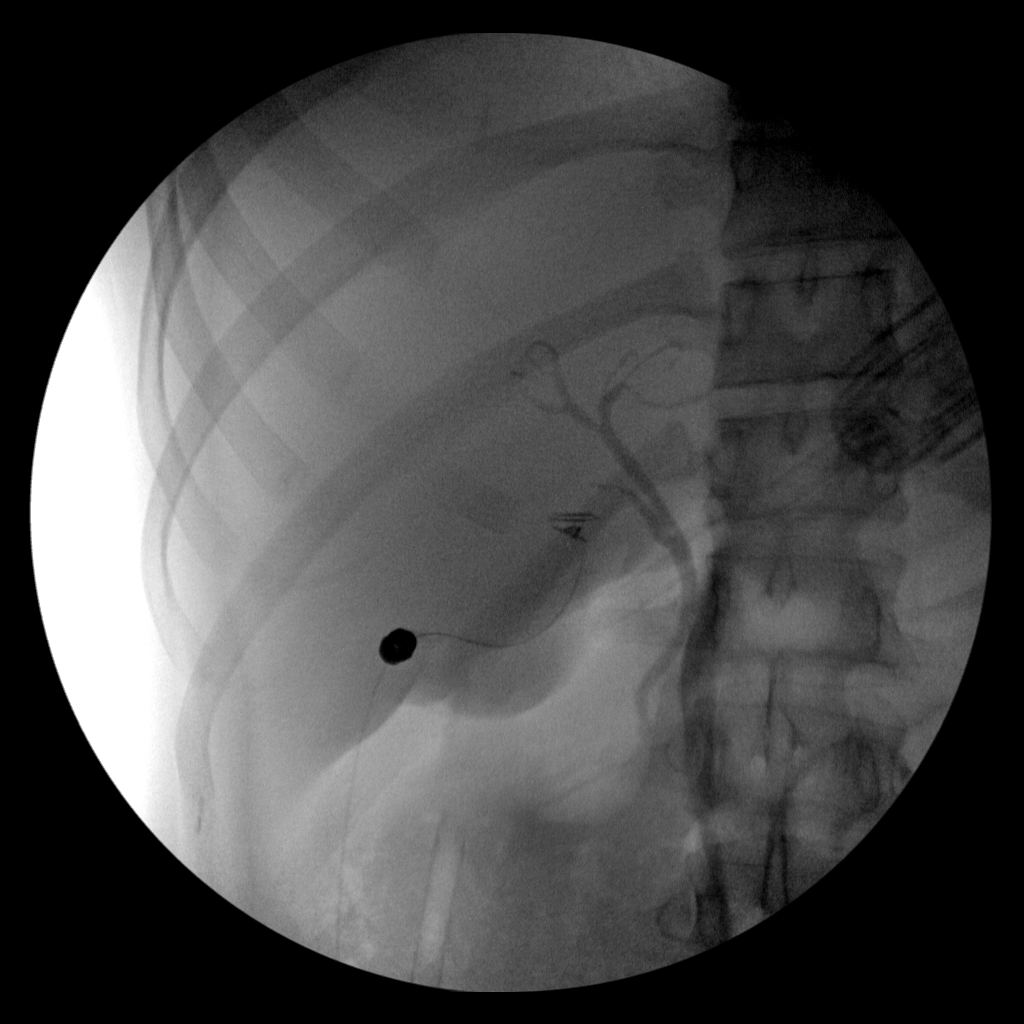
[frame 17/41]
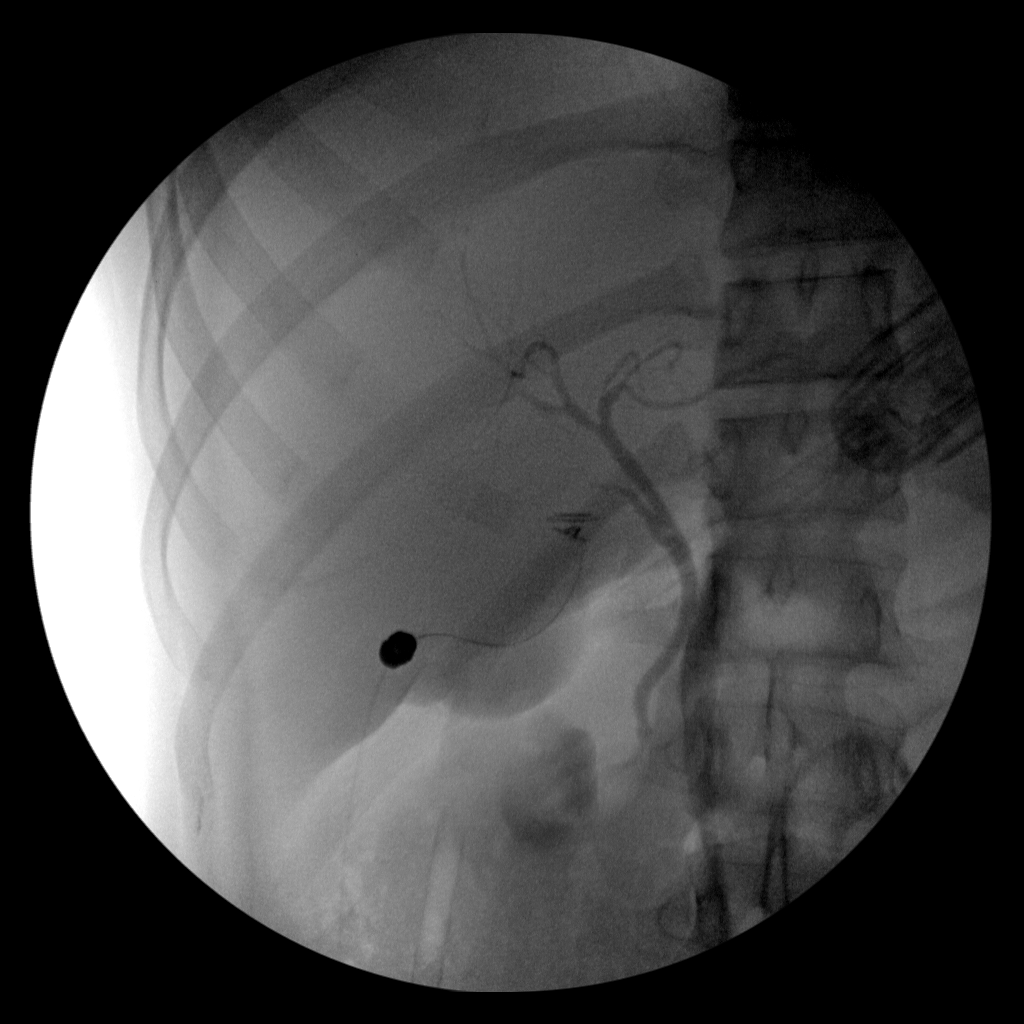
[frame 21/41]
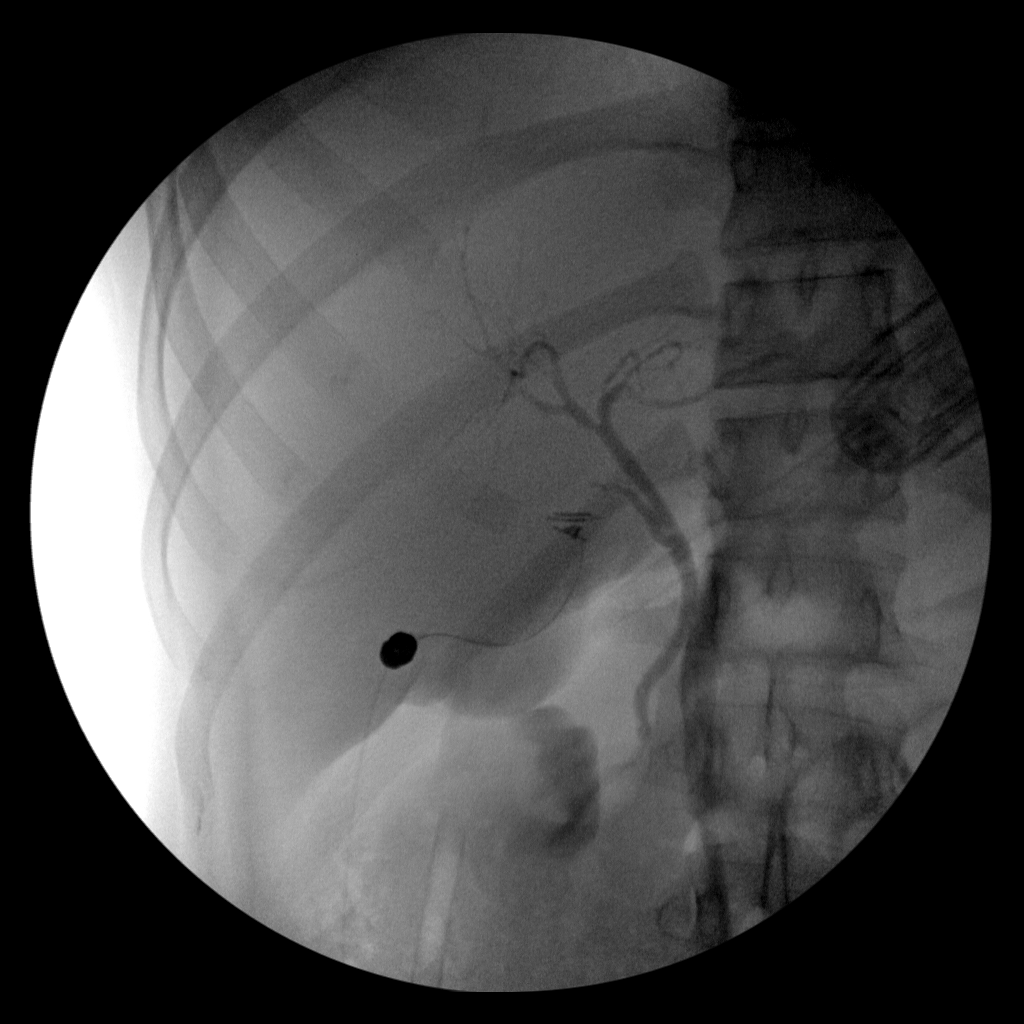
[frame 35/41]
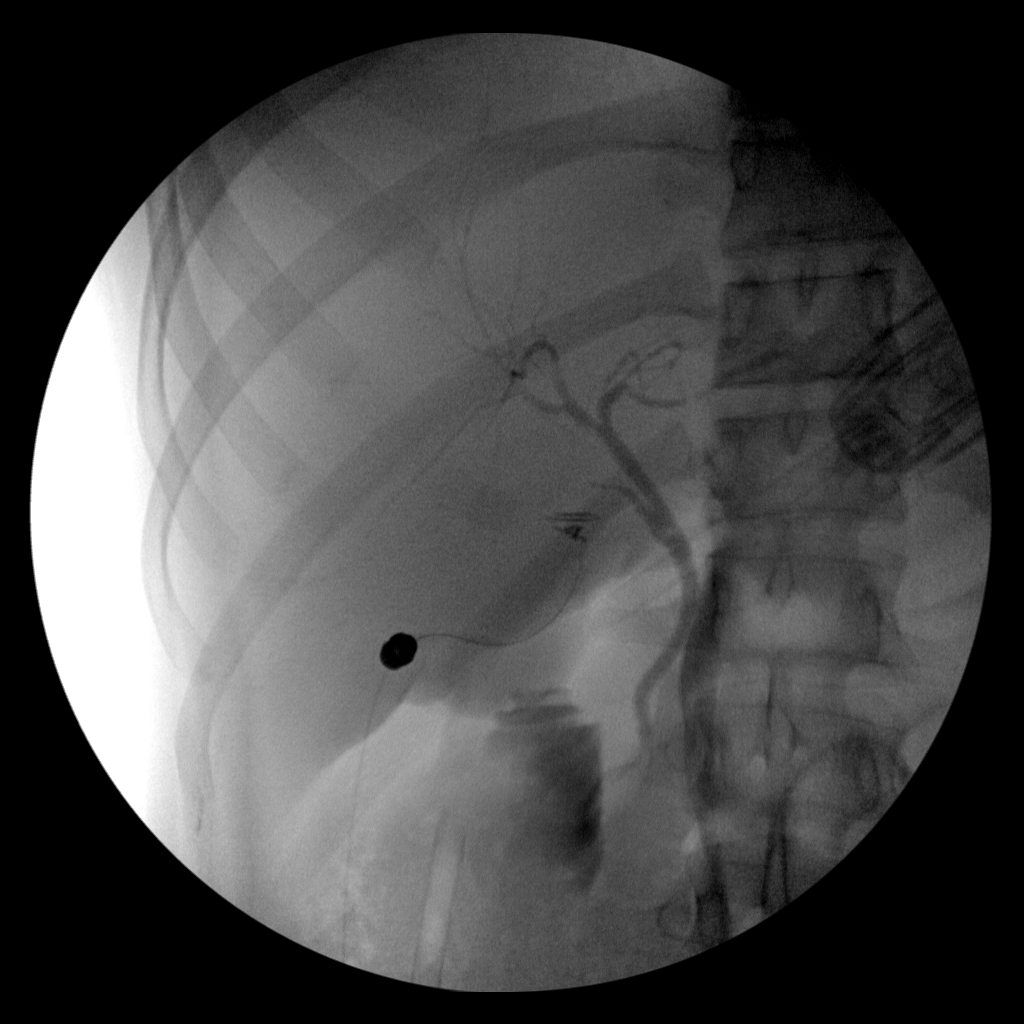

[4 of 4 positions shown; findings below may reference images not displayed]

FINDINGS: Opacification of the intrahepatic and extrahepatic biliary system.
Biliary system is not dilated. Contrast drains into the duodenum. No
obstructing lesions or stones.
IMPRESSION: Patent biliary system.  Normal intraoperative cholangiogram.
